# Patient Record
Sex: Male | Born: 1990 | Race: Black or African American | Hispanic: No | Marital: Single | Smoking: Current every day smoker
Health system: Southern US, Community
[De-identification: ages and names within clinical notes are randomized; demographics above are authoritative.]

## PROBLEM LIST (undated history)

## (undated) DIAGNOSIS — F209 Schizophrenia, unspecified: Secondary | ICD-10-CM

## (undated) DIAGNOSIS — F319 Bipolar disorder, unspecified: Secondary | ICD-10-CM

---
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
## 2012-11-15 ENCOUNTER — Encounter (HOSPITAL_COMMUNITY): Payer: Self-pay | Admitting: Emergency Medicine

## 2012-11-15 ENCOUNTER — Emergency Department (HOSPITAL_COMMUNITY)
Admission: EM | Admit: 2012-11-15 | Discharge: 2012-11-15 | Disposition: A | Discharge status: Home / Self Care | Payer: Self-pay | Attending: Emergency Medicine | Admitting: Emergency Medicine

## 2012-11-15 DIAGNOSIS — T43204A Poisoning by unspecified antidepressants, undetermined, initial encounter: Secondary | ICD-10-CM | POA: Insufficient documentation

## 2012-11-15 DIAGNOSIS — F17200 Nicotine dependence, unspecified, uncomplicated: Secondary | ICD-10-CM | POA: Insufficient documentation

## 2012-11-15 DIAGNOSIS — T50992A Poisoning by other drugs, medicaments and biological substances, intentional self-harm, initial encounter: Secondary | ICD-10-CM | POA: Insufficient documentation

## 2012-11-15 DIAGNOSIS — T438X2A Poisoning by other psychotropic drugs, intentional self-harm, initial encounter: Secondary | ICD-10-CM | POA: Insufficient documentation

## 2012-11-15 DIAGNOSIS — T50905A Adverse effect of unspecified drugs, medicaments and biological substances, initial encounter: Secondary | ICD-10-CM

## 2012-11-15 DIAGNOSIS — T43502A Poisoning by unspecified antipsychotics and neuroleptics, intentional self-harm, initial encounter: Secondary | ICD-10-CM | POA: Insufficient documentation

## 2012-11-15 LAB — RAPID URINE DRUG SCREEN, HOSP PERFORMED
Amphetamines: NOT DETECTED
Barbiturates: NOT DETECTED
Benzodiazepines: NOT DETECTED

## 2012-11-15 LAB — CBC WITH DIFFERENTIAL/PLATELET
Basophils Absolute: 0 10*3/uL (ref 0.0–0.1)
Eosinophils Absolute: 0.1 10*3/uL (ref 0.0–0.7)
Eosinophils Relative: 1 % (ref 0–5)
Lymphocytes Relative: 39 % (ref 12–46)
MCH: 28.6 pg (ref 26.0–34.0)
MCV: 82.8 fL (ref 78.0–100.0)
Platelets: 229 10*3/uL (ref 150–400)
RDW: 13.2 % (ref 11.5–15.5)
WBC: 4.5 10*3/uL (ref 4.0–10.5)

## 2012-11-15 LAB — ACETAMINOPHEN LEVEL: Acetaminophen (Tylenol), Serum: <15 ug/mL (ref 10–30)

## 2012-11-15 LAB — COMPREHENSIVE METABOLIC PANEL
ALT: 13 U/L (ref 0–53)
AST: 23 U/L (ref 0–37)
Calcium: 9.6 mg/dL (ref 8.4–10.5)
Sodium: 137 mEq/L (ref 135–145)
Total Protein: 8.1 g/dL (ref 6.0–8.3)
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
## 2012-12-05 ENCOUNTER — Emergency Department (HOSPITAL_COMMUNITY): Payer: Self-pay

## 2012-12-05 ENCOUNTER — Encounter (HOSPITAL_COMMUNITY): Payer: Self-pay | Admitting: Emergency Medicine

## 2012-12-05 ENCOUNTER — Emergency Department (HOSPITAL_COMMUNITY)
Admission: EM | Admit: 2012-12-05 | Discharge: 2012-12-05 | Disposition: A | Discharge status: Home / Self Care | Payer: Self-pay | Attending: Emergency Medicine | Admitting: Emergency Medicine

## 2012-12-05 DIAGNOSIS — Y9389 Activity, other specified: Secondary | ICD-10-CM | POA: Insufficient documentation

## 2012-12-05 DIAGNOSIS — Y92009 Unspecified place in unspecified non-institutional (private) residence as the place of occurrence of the external cause: Secondary | ICD-10-CM | POA: Insufficient documentation

## 2012-12-05 DIAGNOSIS — F17200 Nicotine dependence, unspecified, uncomplicated: Secondary | ICD-10-CM | POA: Insufficient documentation

## 2012-12-05 DIAGNOSIS — S92309B Fracture of unspecified metatarsal bone(s), unspecified foot, initial encounter for open fracture: Secondary | ICD-10-CM | POA: Insufficient documentation

## 2012-12-05 DIAGNOSIS — S91309A Unspecified open wound, unspecified foot, initial encounter: Secondary | ICD-10-CM | POA: Insufficient documentation

## 2012-12-05 DIAGNOSIS — W3400XA Accidental discharge from unspecified firearms or gun, initial encounter: Secondary | ICD-10-CM | POA: Insufficient documentation

## 2012-12-05 DIAGNOSIS — S91135A Puncture wound without foreign body of left lesser toe(s) without damage to nail, initial encounter: Secondary | ICD-10-CM

## 2012-12-05 MED ORDER — HYDROCODONE-ACETAMINOPHEN 5-325 MG PO TABS: 2.0000 | ORAL_TABLET | Freq: Four times a day (QID) | ORAL | Status: DC | PRN

## 2012-12-05 MED ORDER — CEFAZOLIN SODIUM 1 G IJ SOLR
1.0000 g | Freq: Once | INTRAMUSCULAR | Status: AC
  Administered 2012-12-05: 1 g via INTRAMUSCULAR
  Filled 2012-12-05: qty 10

## 2012-12-05 MED ORDER — OXYCODONE-ACETAMINOPHEN 5-325 MG PO TABS
1.0000 | ORAL_TABLET | Freq: Once | ORAL | Status: AC
  Administered 2012-12-05: 1 via ORAL
  Filled 2012-12-05: qty 1
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
## 2012-12-05 NOTE — ED Notes (Signed)
Pt reluctant to get undressed. Finally complied. Pt placed in gown and clothes, watch and jacket placed in bag and given to [employer]. Several [employer] officers in the hall outside of pt's room.

## 2012-12-05 NOTE — ED Notes (Signed)
Ortho notified of splint orders

## 2013-04-01 ENCOUNTER — Emergency Department (INDEPENDENT_AMBULATORY_CARE_PROVIDER_SITE_OTHER): Payer: Self-pay

## 2013-04-01 ENCOUNTER — Encounter (HOSPITAL_COMMUNITY): Payer: Self-pay | Admitting: *Deleted

## 2013-04-01 ENCOUNTER — Emergency Department (INDEPENDENT_AMBULATORY_CARE_PROVIDER_SITE_OTHER)
Admission: EM | Admit: 2013-04-01 | Discharge: 2013-04-01 | Disposition: A | Discharge status: Home / Self Care | Payer: Self-pay | Source: Home / Self Care | Attending: Emergency Medicine | Admitting: Emergency Medicine

## 2013-04-01 DIAGNOSIS — S43429A Sprain of unspecified rotator cuff capsule, initial encounter: Secondary | ICD-10-CM

## 2013-04-01 DIAGNOSIS — S39012A Strain of muscle, fascia and tendon of lower back, initial encounter: Secondary | ICD-10-CM

## 2013-04-01 DIAGNOSIS — S335XXA Sprain of ligaments of lumbar spine, initial encounter: Secondary | ICD-10-CM

## 2013-04-01 DIAGNOSIS — S46011A Strain of muscle(s) and tendon(s) of the rotator cuff of right shoulder, initial encounter: Secondary | ICD-10-CM

## 2013-04-01 DIAGNOSIS — S139XXA Sprain of joints and ligaments of unspecified parts of neck, initial encounter: Secondary | ICD-10-CM

## 2013-04-01 DIAGNOSIS — S161XXA Strain of muscle, fascia and tendon at neck level, initial encounter: Secondary | ICD-10-CM

## 2013-04-01 MED ORDER — HYDROCODONE-ACETAMINOPHEN 5-325 MG PO TABS: ORAL_TABLET | ORAL | Status: DC

## 2013-04-01 MED ORDER — CYCLOBENZAPRINE HCL 5 MG PO TABS: 5.0000 mg | ORAL_TABLET | Freq: Three times a day (TID) | ORAL | Status: DC | PRN

## 2013-04-01 MED ORDER — HYDROCODONE-ACETAMINOPHEN 5-325 MG PO TABS
1.0000 | ORAL_TABLET | Freq: Once | ORAL | Status: AC
  Administered 2013-04-01: 1 via ORAL

## 2013-04-01 MED ORDER — DICLOFENAC SODIUM 75 MG PO TBEC: 75.0000 mg | DELAYED_RELEASE_TABLET | Freq: Two times a day (BID) | ORAL | Status: DC

## 2013-04-01 MED ORDER — HYDROCODONE-ACETAMINOPHEN 5-325 MG PO TABS
ORAL_TABLET | ORAL | Status: AC
  Filled 2013-04-01: qty 1

## 2013-04-01 MED ORDER — IBUPROFEN 800 MG PO TABS
800.0000 mg | ORAL_TABLET | Freq: Once | ORAL | Status: AC
  Administered 2013-04-01: 800 mg via ORAL

## 2013-04-01 MED ORDER — IBUPROFEN 800 MG PO TABS
ORAL_TABLET | ORAL | Status: AC
  Filled 2013-04-01: qty 1
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
## 2013-04-01 NOTE — ED Notes (Signed)
Patient complains of neck, back and side pain. States stiffness and aching in neck.

## 2015-07-13 ENCOUNTER — Emergency Department (HOSPITAL_COMMUNITY)
Admission: EM | Admit: 2015-07-13 | Discharge: 2015-07-13 | Disposition: A | Discharge status: Home / Self Care | Payer: Self-pay | Attending: Emergency Medicine | Admitting: Emergency Medicine

## 2015-07-13 ENCOUNTER — Encounter (HOSPITAL_COMMUNITY): Payer: Self-pay | Admitting: Emergency Medicine

## 2015-07-13 ENCOUNTER — Emergency Department (HOSPITAL_COMMUNITY): Payer: Self-pay

## 2015-07-13 DIAGNOSIS — S0181XA Laceration without foreign body of other part of head, initial encounter: Secondary | ICD-10-CM

## 2015-07-13 DIAGNOSIS — S0083XA Contusion of other part of head, initial encounter: Secondary | ICD-10-CM

## 2015-07-13 DIAGNOSIS — S199XXA Unspecified injury of neck, initial encounter: Secondary | ICD-10-CM | POA: Insufficient documentation

## 2015-07-13 DIAGNOSIS — S0990XA Unspecified injury of head, initial encounter: Secondary | ICD-10-CM | POA: Insufficient documentation

## 2015-07-13 DIAGNOSIS — S01111A Laceration without foreign body of right eyelid and periocular area, initial encounter: Secondary | ICD-10-CM | POA: Insufficient documentation

## 2015-07-13 DIAGNOSIS — Z720 Tobacco use: Secondary | ICD-10-CM | POA: Insufficient documentation

## 2015-07-13 DIAGNOSIS — Y929 Unspecified place or not applicable: Secondary | ICD-10-CM | POA: Insufficient documentation

## 2015-07-13 DIAGNOSIS — S40211A Abrasion of right shoulder, initial encounter: Secondary | ICD-10-CM | POA: Insufficient documentation

## 2015-07-13 DIAGNOSIS — Z23 Encounter for immunization: Secondary | ICD-10-CM | POA: Insufficient documentation

## 2015-07-13 DIAGNOSIS — K0381 Cracked tooth: Secondary | ICD-10-CM | POA: Insufficient documentation

## 2015-07-13 DIAGNOSIS — S060X1A Concussion with loss of consciousness of 30 minutes or less, initial encounter: Secondary | ICD-10-CM | POA: Insufficient documentation

## 2015-07-13 DIAGNOSIS — Y999 Unspecified external cause status: Secondary | ICD-10-CM | POA: Insufficient documentation

## 2015-07-13 DIAGNOSIS — Y939 Activity, unspecified: Secondary | ICD-10-CM | POA: Insufficient documentation

## 2015-07-13 LAB — SAMPLE TO BLOOD BANK

## 2015-07-13 LAB — ETHANOL

## 2015-07-13 LAB — CDS SEROLOGY

## 2015-07-13 MED ORDER — HYDROCODONE-ACETAMINOPHEN 5-325 MG PO TABS: 2.0000 | ORAL_TABLET | Freq: Four times a day (QID) | ORAL | Status: DC | PRN

## 2015-07-13 MED ORDER — LIDOCAINE-EPINEPHRINE 1 %-1:100000 IJ SOLN
10.0000 mL | Freq: Once | INTRAMUSCULAR | Status: AC
  Administered 2015-07-13: 10 mL via INTRADERMAL
  Filled 2015-07-13: qty 1

## 2015-07-13 MED ORDER — ONDANSETRON HCL 4 MG PO TABS: 4.0000 mg | ORAL_TABLET | Freq: Four times a day (QID) | ORAL | Status: DC

## 2015-07-13 MED ORDER — OXYCODONE-ACETAMINOPHEN 5-325 MG PO TABS
1.0000 | ORAL_TABLET | Freq: Once | ORAL | Status: AC
  Administered 2015-07-13: 1 via ORAL
  Filled 2015-07-13: qty 1

## 2015-07-13 MED ORDER — ONDANSETRON HCL 4 MG PO TABS
4.0000 mg | ORAL_TABLET | Freq: Once | ORAL | Status: AC
  Administered 2015-07-13: 4 mg via ORAL
  Filled 2015-07-13: qty 1

## 2015-07-13 MED ORDER — TETANUS-DIPHTH-ACELL PERTUSSIS 5-2.5-18.5 LF-MCG/0.5 IM SUSP
0.5000 mL | Freq: Once | INTRAMUSCULAR | Status: AC
  Administered 2015-07-13: 0.5 mL via INTRAMUSCULAR
  Filled 2015-07-13: qty 0.5

## 2015-07-13 MED ORDER — HYDROMORPHONE HCL 1 MG/ML IJ SOLN
1.0000 mg | Freq: Once | INTRAMUSCULAR | Status: AC
  Administered 2015-07-13: 1 mg via INTRAVENOUS
  Filled 2015-07-13: qty 1
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
## 2015-07-13 NOTE — Discharge Instructions (Signed)
Concussion  A concussion, or closed-head injury, is a brain injury caused by a direct blow to the head or by a quick and sudden movement (jolt) of the head or neck. Concussions are usually not life-threatening. Even so, the effects of a concussion can be serious. If you have had a concussion before, you are more likely to experience concussion-like symptoms after a direct blow to the head.   CAUSES  · Direct blow to the head, such as from running into another player during a soccer game, being hit in a fight, or hitting your head on a hard surface.  · A jolt of the head or neck that causes the brain to move back and forth inside the skull, such as in a car crash.  SIGNS AND SYMPTOMS  The signs of a concussion can be hard to notice. Early on, they may be missed by you, family members, and health care providers. You may look fine but act or feel differently.  Symptoms are usually temporary, but they may last for days, weeks, or even longer. Some symptoms may appear right away while others may not show up for hours or days. Every head injury is different. Symptoms include:  · Mild to moderate headaches that will not go away.  · A feeling of pressure inside your head.  · Having more trouble than usual:  ¨ Learning or remembering things you have heard.  ¨ Answering questions.  ¨ Paying attention or concentrating.  ¨ Organizing daily tasks.  ¨ Making decisions and solving problems.  · Slowness in thinking, acting or reacting, speaking, or reading.  · Getting lost or being easily confused.  · Feeling tired all the time or lacking energy (fatigued).  · Feeling drowsy.  · Sleep disturbances.  ¨ Sleeping more than usual.  ¨ Sleeping less than usual.  ¨ Trouble falling asleep.  ¨ Trouble sleeping (insomnia).  · Loss of balance or feeling lightheaded or dizzy.  · Nausea or vomiting.  · Numbness or tingling.  · Increased sensitivity to:  ¨ Sounds.  ¨ Lights.  ¨ Distractions.  · Vision problems or eyes that tire  easily.  · Diminished sense of taste or smell.  · Ringing in the ears.  · Mood changes such as feeling sad or anxious.  · Becoming easily irritated or angry for little or no reason.  · Lack of motivation.  · Seeing or hearing things other people do not see or hear (hallucinations).  DIAGNOSIS  Your health care provider can usually diagnose a concussion based on a description of your injury and symptoms. He or she will ask whether you passed out (lost consciousness) and whether you are having trouble remembering events that happened right before and during your injury.  Your evaluation might include:  · A brain scan to look for signs of injury to the brain. Even if the test shows no injury, you may still have a concussion.  · Blood tests to be sure other problems are not present.  TREATMENT  · Concussions are usually treated in an emergency department, in urgent care, or at a clinic. You may need to stay in the hospital overnight for further treatment.  · Tell your health care provider if you are taking any medicines, including prescription medicines, over-the-counter medicines, and natural remedies. Some medicines, such as blood thinners (anticoagulants) and aspirin, may increase the chance of complications. Also tell your health care provider whether you have had alcohol or are taking illegal drugs. This information   may affect treatment.  · Your health care provider will send you home with important instructions to follow.  · How fast you will recover from a concussion depends on many factors. These factors include how severe your concussion is, what part of your brain was injured, your age, and how healthy you were before the concussion.  · Most people with mild injuries recover fully. Recovery can take time. In general, recovery is slower in older persons. Also, persons who have had a concussion in the past or have other medical problems may find that it takes longer to recover from their current injury.  HOME  CARE INSTRUCTIONS  General Instructions  · Carefully follow the directions your health care provider gave you.  · Only take over-the-counter or prescription medicines for pain, discomfort, or fever as directed by your health care provider.  · Take only those medicines that your health care provider has approved.  · Do not drink alcohol until your health care provider says you are well enough to do so. Alcohol and certain other drugs may slow your recovery and can put you at risk of further injury.  · If it is harder than usual to remember things, write them down.  · If you are easily distracted, try to do one thing at a time. For example, do not try to watch TV while fixing dinner.  · Talk with family members or close friends when making important decisions.  · Keep all follow-up appointments. Repeated evaluation of your symptoms is recommended for your recovery.  · Watch your symptoms and tell others to do the same. Complications sometimes occur after a concussion. Older adults with a brain injury may have a higher risk of serious complications, such as a blood clot on the brain.  · Tell your teachers, school nurse, school counselor, coach, athletic trainer, or work manager about your injury, symptoms, and restrictions. Tell them about what you can or cannot do. They should watch for:  ¨ Increased problems with attention or concentration.  ¨ Increased difficulty remembering or learning new information.  ¨ Increased time needed to complete tasks or assignments.  ¨ Increased irritability or decreased ability to cope with stress.  ¨ Increased symptoms.  · Rest. Rest helps the brain to heal. Make sure you:  ¨ Get plenty of sleep at night. Avoid staying up late at night.  ¨ Keep the same bedtime hours on weekends and weekdays.  ¨ Rest during the day. Take daytime naps or rest breaks when you feel tired.  · Limit activities that require a lot of thought or concentration. These include:  ¨ Doing homework or job-related  work.  ¨ Watching TV.  ¨ Working on the computer.  · Avoid any situation where there is potential for another head injury (football, hockey, soccer, basketball, martial arts, downhill snow sports and horseback riding). Your condition will get worse every time you experience a concussion. You should avoid these activities until you are evaluated by the appropriate follow-up health care providers.  Returning To Your Regular Activities  You will need to return to your normal activities slowly, not all at once. You must give your body and brain enough time for recovery.  · Do not return to sports or other athletic activities until your health care provider tells you it is safe to do so.  · Ask your health care provider when you can drive, ride a bicycle, or operate heavy machinery. Your ability to react may be slower after a   brain injury. Never do these activities if you are dizzy.  · Ask your health care provider about when you can return to work or school.  Preventing Another Concussion  It is very important to avoid another brain injury, especially before you have recovered. In rare cases, another injury can lead to permanent brain damage, brain swelling, or death. The risk of this is greatest during the first 7-10 days after a head injury. Avoid injuries by:  · Wearing a seat belt when riding in a car.  · Drinking alcohol only in moderation.  · Wearing a helmet when biking, skiing, skateboarding, skating, or doing similar activities.  · Avoiding activities that could lead to a second concussion, such as contact or recreational sports, until your health care provider says it is okay.  · Taking safety measures in your home.  ¨ Remove clutter and tripping hazards from floors and stairways.  ¨ Use grab bars in bathrooms and handrails by stairs.  ¨ Place non-slip mats on floors and in bathtubs.  ¨ Improve lighting in dim areas.  SEEK MEDICAL CARE IF:  · You have increased problems paying attention or  concentrating.  · You have increased difficulty remembering or learning new information.  · You need more time to complete tasks or assignments than before.  · You have increased irritability or decreased ability to cope with stress.  · You have more symptoms than before.  Seek medical care if you have any of the following symptoms for more than 2 weeks after your injury:  · Lasting (chronic) headaches.  · Dizziness or balance problems.  · Nausea.  · Vision problems.  · Increased sensitivity to noise or light.  · Depression or mood swings.  · Anxiety or irritability.  · Memory problems.  · Difficulty concentrating or paying attention.  · Sleep problems.  · Feeling tired all the time.  SEEK IMMEDIATE MEDICAL CARE IF:  · You have severe or worsening headaches. These may be a sign of a blood clot in the brain.  · You have weakness (even if only in one hand, leg, or part of the face).  · You have numbness.  · You have decreased coordination.  · You vomit repeatedly.  · You have increased sleepiness.  · One pupil is larger than the other.  · You have convulsions.  · You have slurred speech.  · You have increased confusion. This may be a sign of a blood clot in the brain.  · You have increased restlessness, agitation, or irritability.  · You are unable to recognize people or places.  · You have neck pain.  · It is difficult to wake you up.  · You have unusual behavior changes.  · You lose consciousness.  MAKE SURE YOU:  · Understand these instructions.  · Will watch your condition.  · Will get help right away if you are not doing well or get worse.  Document Released: 02/12/2004 Document Revised: 11/27/2013 Document Reviewed: 06/14/2013  [employer]® Patient Information ©2015 [employer]. This information is not intended to replace advice given to you by your health care provider. Make sure you discuss any questions you have with your health care provider.

## 2015-07-13 NOTE — ED Notes (Signed)
Pt ambulated to the bathroom with ease 

## 2015-07-13 NOTE — ED Notes (Signed)
Patient returned from CT
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
## 2019-12-25 ENCOUNTER — Inpatient Hospital Stay (HOSPITAL_COMMUNITY)
Admission: RE | Admit: 2019-12-25 | Discharge: 2020-01-02 | DRG: 885 | Disposition: A | Discharge status: Home / Self Care | Payer: Federal, State, Local not specified - Other | Attending: Psychiatry | Admitting: Psychiatry

## 2019-12-25 ENCOUNTER — Ambulatory Visit (HOSPITAL_COMMUNITY)
Admission: RE | Admit: 2019-12-25 | Discharge: 2019-12-25 | Disposition: A | Discharge status: Home / Self Care | Payer: Self-pay | Attending: Psychiatry | Admitting: Psychiatry

## 2019-12-25 DIAGNOSIS — F209 Schizophrenia, unspecified: Secondary | ICD-10-CM | POA: Insufficient documentation

## 2019-12-25 DIAGNOSIS — Z20822 Contact with and (suspected) exposure to covid-19: Secondary | ICD-10-CM | POA: Diagnosis present

## 2019-12-25 DIAGNOSIS — T43595A Adverse effect of other antipsychotics and neuroleptics, initial encounter: Secondary | ICD-10-CM | POA: Diagnosis not present

## 2019-12-25 DIAGNOSIS — R45851 Suicidal ideations: Secondary | ICD-10-CM

## 2019-12-25 DIAGNOSIS — I952 Hypotension due to drugs: Secondary | ICD-10-CM | POA: Diagnosis not present

## 2019-12-25 DIAGNOSIS — F322 Major depressive disorder, single episode, severe without psychotic features: Secondary | ICD-10-CM

## 2019-12-25 DIAGNOSIS — F17210 Nicotine dependence, cigarettes, uncomplicated: Secondary | ICD-10-CM | POA: Diagnosis present

## 2019-12-25 DIAGNOSIS — Y92239 Unspecified place in hospital as the place of occurrence of the external cause: Secondary | ICD-10-CM | POA: Diagnosis not present

## 2019-12-25 DIAGNOSIS — Z653 Problems related to other legal circumstances: Secondary | ICD-10-CM

## 2019-12-25 DIAGNOSIS — F5105 Insomnia due to other mental disorder: Secondary | ICD-10-CM | POA: Diagnosis present

## 2019-12-25 HISTORY — DX: Schizophrenia, unspecified: F20.9

## 2019-12-25 HISTORY — DX: Bipolar disorder, unspecified: F31.9

## 2019-12-26 ENCOUNTER — Other Ambulatory Visit: Payer: Self-pay

## 2019-12-26 ENCOUNTER — Encounter (HOSPITAL_COMMUNITY): Payer: Self-pay | Admitting: Psychiatry

## 2019-12-26 DIAGNOSIS — R440 Auditory hallucinations: Secondary | ICD-10-CM

## 2019-12-26 DIAGNOSIS — Z20822 Contact with and (suspected) exposure to covid-19: Secondary | ICD-10-CM | POA: Diagnosis present

## 2019-12-26 DIAGNOSIS — F209 Schizophrenia, unspecified: Principal | ICD-10-CM | POA: Diagnosis present

## 2019-12-26 DIAGNOSIS — Y92239 Unspecified place in hospital as the place of occurrence of the external cause: Secondary | ICD-10-CM | POA: Diagnosis not present

## 2019-12-26 DIAGNOSIS — F201 Disorganized schizophrenia: Secondary | ICD-10-CM

## 2019-12-26 DIAGNOSIS — F17210 Nicotine dependence, cigarettes, uncomplicated: Secondary | ICD-10-CM | POA: Diagnosis present

## 2019-12-26 DIAGNOSIS — F5105 Insomnia due to other mental disorder: Secondary | ICD-10-CM | POA: Diagnosis present

## 2019-12-26 DIAGNOSIS — I952 Hypotension due to drugs: Secondary | ICD-10-CM | POA: Diagnosis not present

## 2019-12-26 DIAGNOSIS — Z653 Problems related to other legal circumstances: Secondary | ICD-10-CM | POA: Diagnosis not present

## 2019-12-26 DIAGNOSIS — R45850 Homicidal ideations: Secondary | ICD-10-CM

## 2019-12-26 DIAGNOSIS — F200 Paranoid schizophrenia: Secondary | ICD-10-CM | POA: Diagnosis not present

## 2019-12-26 DIAGNOSIS — T43595A Adverse effect of other antipsychotics and neuroleptics, initial encounter: Secondary | ICD-10-CM | POA: Diagnosis not present

## 2019-12-26 LAB — URINALYSIS, COMPLETE (UACMP) WITH MICROSCOPIC
Bilirubin Urine: NEGATIVE
Glucose, UA: NEGATIVE mg/dL
Hgb urine dipstick: NEGATIVE
Ketones, ur: 5 mg/dL — AB
Leukocytes,Ua: NEGATIVE
Nitrite: NEGATIVE
Protein, ur: 30 mg/dL — AB
Specific Gravity, Urine: 1.033 — ABNORMAL HIGH (ref 1.005–1.030)
pH: 6 (ref 5.0–8.0)

## 2019-12-26 LAB — HEMOGLOBIN A1C
Hgb A1c MFr Bld: 5.5 % (ref 4.8–5.6)
Mean Plasma Glucose: 111.15 mg/dL

## 2019-12-26 LAB — RAPID URINE DRUG SCREEN, HOSP PERFORMED
Amphetamines: NOT DETECTED
Barbiturates: NOT DETECTED
Benzodiazepines: NOT DETECTED
Cocaine: NOT DETECTED
Opiates: NOT DETECTED
Tetrahydrocannabinol: NOT DETECTED

## 2019-12-26 LAB — ETHANOL: Alcohol, Ethyl (B): <10 mg/dL (ref <10)

## 2019-12-26 LAB — HEPATIC FUNCTION PANEL
ALT: 25 U/L (ref 0–44)
AST: 35 U/L (ref 15–41)
Albumin: 4.3 g/dL (ref 3.5–5.0)
Alkaline Phosphatase: 75 U/L (ref 38–126)
Bilirubin, Direct: 0.1 mg/dL (ref 0.0–0.2)
Indirect Bilirubin: 0.6 mg/dL (ref 0.3–0.9)
Total Bilirubin: 0.7 mg/dL (ref 0.3–1.2)
Total Protein: 6.4 g/dL — ABNORMAL LOW (ref 6.5–8.1)

## 2019-12-26 LAB — MAGNESIUM: Magnesium: 2 mg/dL (ref 1.7–2.4)

## 2019-12-26 LAB — LIPID PANEL
Cholesterol: 134 mg/dL (ref 0–200)
HDL: 43 mg/dL (ref >40)
LDL Cholesterol: 82 mg/dL (ref 0–99)
Total CHOL/HDL Ratio: 3.1 RATIO
Triglycerides: 46 mg/dL (ref <150)
VLDL: 9 mg/dL (ref 0–40)

## 2019-12-26 LAB — COMPREHENSIVE METABOLIC PANEL
ALT: 23 U/L (ref 0–44)
AST: 35 U/L (ref 15–41)
Albumin: 4.2 g/dL (ref 3.5–5.0)
Alkaline Phosphatase: 74 U/L (ref 38–126)
Anion gap: 7 (ref 5–15)
BUN: 13 mg/dL (ref 6–20)
CO2: 26 mmol/L (ref 22–32)
Calcium: 9.2 mg/dL (ref 8.9–10.3)
Chloride: 107 mmol/L (ref 98–111)
Creatinine, Ser: 0.79 mg/dL (ref 0.61–1.24)
GFR calc Af Amer: >60 mL/min (ref >60)
GFR calc non Af Amer: >60 mL/min (ref >60)
Glucose, Bld: 100 mg/dL — ABNORMAL HIGH (ref 70–99)
Potassium: 3.4 mmol/L — ABNORMAL LOW (ref 3.5–5.1)
Sodium: 140 mmol/L (ref 135–145)
Total Bilirubin: 0.7 mg/dL (ref 0.3–1.2)
Total Protein: 6.5 g/dL (ref 6.5–8.1)

## 2019-12-26 LAB — RESPIRATORY PANEL BY RT PCR (FLU A&B, COVID)
Influenza A by PCR: NEGATIVE
Influenza B by PCR: NEGATIVE
SARS Coronavirus 2 by RT PCR: NEGATIVE

## 2019-12-26 LAB — GLUCOSE, CAPILLARY: Glucose-Capillary: 92 mg/dL (ref 70–99)

## 2019-12-26 LAB — TSH: TSH: 0.922 u[IU]/mL (ref 0.350–4.500)

## 2019-12-26 MED ORDER — TRAZODONE HCL 50 MG PO TABS: 50.0000 mg | ORAL_TABLET | Freq: Every evening | ORAL | Status: DC | PRN

## 2019-12-26 MED ORDER — RISPERIDONE 3 MG PO TABS
3.0000 mg | ORAL_TABLET | Freq: Two times a day (BID) | ORAL | Status: DC
  Administered 2019-12-26: 17:00:00 3 mg via ORAL
  Filled 2019-12-26 (×4): qty 1

## 2019-12-26 MED ORDER — TRAZODONE HCL 150 MG PO TABS
300.0000 mg | ORAL_TABLET | Freq: Every day | ORAL | Status: DC
  Administered 2019-12-26 – 2019-12-27 (×2): 300 mg via ORAL
  Filled 2019-12-26 (×4): qty 2

## 2019-12-26 MED ORDER — ALUM & MAG HYDROXIDE-SIMETH 200-200-20 MG/5ML PO SUSP
30.0000 mL | ORAL | Status: DC | PRN
Start: 2019-12-26 — End: 2020-01-02

## 2019-12-26 MED ORDER — BENZTROPINE MESYLATE 1 MG PO TABS
1.0000 mg | ORAL_TABLET | Freq: Two times a day (BID) | ORAL | Status: DC
  Administered 2019-12-26: 17:00:00 1 mg via ORAL
  Filled 2019-12-26 (×6): qty 1

## 2019-12-26 MED ORDER — DIVALPROEX SODIUM 250 MG PO DR TAB
250.0000 mg | DELAYED_RELEASE_TABLET | Freq: Three times a day (TID) | ORAL | Status: DC
  Administered 2019-12-26 – 2019-12-27 (×3): 250 mg via ORAL
  Filled 2019-12-26 (×9): qty 1

## 2019-12-26 MED ORDER — HYDROXYZINE HCL 25 MG PO TABS
25.0000 mg | ORAL_TABLET | Freq: Three times a day (TID) | ORAL | Status: DC | PRN
  Administered 2019-12-28 – 2020-01-01 (×6): 25 mg via ORAL
  Filled 2019-12-26 (×6): qty 1

## 2019-12-26 MED ORDER — MAGNESIUM HYDROXIDE 400 MG/5ML PO SUSP: 30.0000 mL | Freq: Every day | ORAL | Status: DC | PRN

## 2019-12-26 MED ORDER — RISPERIDONE 1 MG PO TABS
1.0000 mg | ORAL_TABLET | Freq: Two times a day (BID) | ORAL | Status: DC
  Administered 2019-12-26: 09:00:00 1 mg via ORAL
  Filled 2019-12-26 (×3): qty 1

## 2019-12-26 MED ORDER — ACETAMINOPHEN 325 MG PO TABS
650.0000 mg | ORAL_TABLET | Freq: Four times a day (QID) | ORAL | Status: DC | PRN
  Administered 2019-12-30 – 2020-01-01 (×2): 650 mg via ORAL
  Filled 2019-12-26 (×2): qty 2
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
## 2019-12-26 NOTE — Progress Notes (Signed)
This morning staff entered into patient's room to check his cbg, when patient saw staff and he become sexually occupied and exposed hisself to Staff. Patient was redirected and informed that his actions were inappropriate.

## 2019-12-26 NOTE — Plan of Care (Signed)
[facility] Observation Crisis Plan  Reason for Crisis Plan:  Crisis Stabilization   Plan of Care:  Referral for IOP  Family Support:      Current Living Environment:     Insurance:   Hospital Account    Name Acct ID Class Status Primary Coverage   Alex, Kline [id number] BEHAVIORAL HEALTH OBSERVATION Open None        Guarantor Account (for Hospital Account [id number])    Name Relation to Pt Service Area Active? Acct Type   [name] Self [facility] Yes [facility]   Address Phone       [address] [place], [place] [address] [phone])          Coverage Information (for Hospital Account [id number])    Not on file      Legal Guardian:     Primary Care Provider:  Default, Provider, MD  Current Outpatient Providers:  None  Psychiatrist:     Counselor/Therapist:     Compliant with Medications:  No  Additional Information:   Tasia Catchings 1/20/202112:40 AM

## 2019-12-26 NOTE — Progress Notes (Signed)
Psychoeducational Group Note  Date:  12/26/2019 Time:  2207  Group Topic/Focus:  Wrap-Up Group:   The focus of this group is to help patients review their daily goal of treatment and discuss progress on daily workbooks.  Participation Level: Did Not Attend  Participation Quality:  Not Applicable  Affect:  Not Applicable  Cognitive:  Not Applicable  Insight:  Not Applicable  Engagement in Group: Not Applicable  Additional Comments:  The patient did not attend group since he was asleep in his bedroom.   Hazle Coca S 12/26/2019, 10:07 PM

## 2019-12-26 NOTE — Discharge Summary (Addendum)
  Patient to be transferred to [facility] inpatient for psychiatric treatment  Attest to NP Note

## 2019-12-26 NOTE — Progress Notes (Signed)
   12/26/19 2000  Psych Admission Type (Psych Patients Only)  Admission Status Voluntary  Psychosocial Assessment  Patient Complaints Isolation  Eye Contact Fair  Facial Expression Flat  Affect Sad;Flat  Speech Soft  Interaction Guarded  Motor Activity Slow  Appearance/Hygiene Unremarkable  Behavior Characteristics Cooperative  Mood Anxious  Thought Process  Coherency Unable to assess  Content Ambivalence  Delusions WDL  Perception Hallucinations  Hallucination Auditory  Judgment Impaired  Confusion None  Danger to Self  Current suicidal ideation? Denies  Self-Injurious Behavior No self-injurious ideation or behavior indicators observed or expressed   Agreement Not to Harm Self Yes  Description of Agreement Verbal  Danger to Others  Danger to Others None reported or observed  Danger to Others Abnormal  Harmful Behavior to others Threats of violence towards other people observed or expressed   Destructive Behavior No threats or harm toward property   Pt isolates to his room , stated he was ok
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
## 2019-12-26 NOTE — BH Assessment (Signed)
[facility]HH Assessment Progress Note   Per Shuvon Rankin, FNP, this pt requires psychiatric hospitalization at this time.  Pt has been assigned pt to [facility] virtual Rm [id number] in anticipation of a bed becoming available later today.  Pt's nurse has been notified.  Doylene Canning, [place] [facility] Coordinator [phone]
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
## 2019-12-27 DIAGNOSIS — F200 Paranoid schizophrenia: Secondary | ICD-10-CM

## 2019-12-27 LAB — PROLACTIN: Prolactin: 24.8 ng/mL — ABNORMAL HIGH (ref 4.0–15.2)

## 2019-12-27 MED ORDER — OLANZAPINE 10 MG PO TBDP
10.0000 mg | ORAL_TABLET | Freq: Every day | ORAL | Status: DC
  Administered 2019-12-27: 21:00:00 10 mg via ORAL
  Filled 2019-12-27 (×3): qty 1

## 2019-12-27 MED ORDER — BENZTROPINE MESYLATE 0.5 MG PO TABS
0.5000 mg | ORAL_TABLET | Freq: Two times a day (BID) | ORAL | Status: DC
  Administered 2019-12-27 – 2019-12-28 (×2): 0.5 mg via ORAL
  Filled 2019-12-27 (×4): qty 1

## 2019-12-27 MED ORDER — RISPERIDONE 1 MG PO TABS
1.0000 mg | ORAL_TABLET | Freq: Two times a day (BID) | ORAL | Status: DC
  Filled 2019-12-27 (×4): qty 1
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
## 2019-12-27 NOTE — [facility] Group Notes (Cosign Needed)
[facility] LCSW Group Therapy Note  Date/Time: 1/21 1:30pm   Type of Therapy and Topic:  Group Therapy:  Overcoming Obstacles  Participation Level:  [facility] PARTICIPATION LEVEL: Active  Description of Group:    In this group patients will be encouraged to explore what they see as obstacles to their own wellness and recovery. They will be guided to discuss their thoughts, feelings, and behaviors related to these obstacles. The group will process together ways to cope with barriers, with attention given to specific choices patients can make. Each patient will be challenged to identify changes they are motivated to make in order to overcome their obstacles. This group will be process-oriented, with patients participating in exploration of their own experiences as well as giving and receiving support and challenge from other group members.  Therapeutic Goals: 1. Patient will identify personal and current obstacles as they relate to admission. 2. Patient will identify barriers that currently interfere with their wellness or overcoming obstacles.  3. Patient will identify feelings, thought process and behaviors related to these barriers. 4. Patient will identify two changes they are willing to make to overcome these obstacles:    Summary of Patient Progress  Pt was active and engaged in group. Pt was able to state his obstacles as an "out of body experience". Pt's plan is to take the bus and like people.    Therapeutic Modalities:   Cognitive Behavioral Therapy Solution Focused Therapy Motivational Interviewing Relapse Prevention Therapy   Earlyne Iba, MSW intern

## 2019-12-27 NOTE — Progress Notes (Signed)
Another patient reported to the staff that their was a strange odor in one of the bedrooms. The staff went into the patient's room and determined that the odor was coming from the patient's heating system. The patient was instructed to not use the heating system until it is checked by maintenance. The front panel to the healing system is very loose at this time.

## 2019-12-27 NOTE — [facility] Suicide Risk Assessment (Addendum)
[facility] INPATIENT:  Family/Significant Other Suicide Prevention Education  Suicide Prevention Education:  Contact Attempts: with sister, Alex Kline, [phone] has been identified by the patient as the family member/significant other with whom the patient will be residing, and identified as the person(s) who will aid the patient in the event of a mental health crisis.  With written consent from the patient, two attempts were made to provide suicide prevention education, prior to and/or following the patient's discharge.  We were unsuccessful in providing suicide prevention education.  A suicide education pamphlet was given to the patient to share with family/significant other.  Date and time of first attempt: 12/27/19 @ 11:20 am  Date and time of second attempt: 01/01/2020 @ 11:06am.   CSW attempted twice to contact pt's sister and was not successful. EDIT: 1/26    Reynold Bowen 12/27/2019, 11:19 AM

## 2019-12-27 NOTE — B[facility] Counselor (Signed)
Adult Comprehensive Assessment  Patient ID: Alex Kline, male   DOB: [birth year], 29 y.o.   MRN: [id number]  Information Source: Information source: Patient  Current Stressors:  Patient states their primary concerns and needs for treatment are:: "family issue" Patient states their goals for this hospitilization and ongoing recovery are:: "get better" Educational / Learning stressors: pt denies Employment / Job issues: pt denies Family Relationships: 'family issues.arguments' Financial / Lack of resources (include bankruptcy): pt denies Housing / Lack of housing: "finding place to go" Physical health (include injuries & life threatening diseases): "foot hurts" Social relationships: pt denies Substance abuse: pt denies Bereavement / Loss: pt denies  Living/Environment/Situation:  Living Arrangements: Other (Comment) Who else lives in the home?: parents kicked him out  Family History:  Marital status: Single Are you sexually active?: Yes What is your sexual orientation?: heterosexual Does patient have children?: Yes How many children?: 1 How is patient's relationship with their children?: "I like him"  Childhood History:  By whom was/is the patient raised?: Both parents Description of patient's relationship with caregiver when they were a child: "good" Patient's description of current relationship with people who raised him/her: "not so good" How were you disciplined when you got in trouble as a child/adolescent?: whippings ans time out Does patient have siblings?: Yes Number of Siblings: 3 Description of patient's current relationship with siblings: "alrights" Did patient suffer any verbal/emotional/physical/sexual abuse as a child?: No Did patient suffer from severe childhood neglect?: No Has patient ever been sexually abused/assaulted/raped as an adolescent or adult?: No Was the patient ever a victim of a crime or a disaster?: No Witnessed domestic violence?: No Has  patient been effected by domestic violence as an adult?: No  Education:  Highest grade of school patient has completed: GED Currently a Ship broker?: No Learning disability?: No  Employment/Work Situation:   Employment situation: Unemployed What is the longest time patient has a held a job?: 6-7 months Where was the patient employed at that time?: [employer] Are There Guns or Chiropractor in [place]?: No  Financial Resources:   Financial resources: No income  Alcohol/Substance Abuse:   What has been your use of drugs/alcohol within the last 12 months?: pt denies Alcohol/Substance Abuse Treatment Hx: Denies past history  Social Support System:   Heritage manager System: Manufacturing engineer System: "family" Type of faith/religion: Muslim  Leisure/Recreation:   Leisure and Hobbies: draw  Strengths/Needs:   What is the patient's perception of their strengths?: "talking to girls"  Discharge Plan:   Currently receiving community mental health services: No Patient states concerns and preferences for aftercare planning are: therapy Patient states they will know when they are safe and ready for discharge when: "feel calm" Does patient have access to transportation?: Yes Will patient be returning to same living situation after discharge?: No  Summary/Recommendations:   Summary and Recommendations (to be completed by the evaluator): Pt is a 29 year old male who is diagnosed with Schizophrenia. Pt states "I am here because I have family problems, I need help understanding my family". Pt is sexually preoccupied. Pt states that he is here because his family thinks he should be here, he denies that he does not really know why he is here but then admits that he does hallucinate and see's and hears demons. Pt denies any SI, HI, and any substance use. Pt states that he just got out of prison 3 days ago and that he struggles with transition back into society. Recommendations  for pt; crisis stabilization, therapeutic milieu, medication management, attend and participate in group therapy, and development of a comprehensive mental wellness plan.  Reynold Bowen. 12/27/2019

## 2019-12-27 NOTE — Progress Notes (Signed)
Recreation Therapy Notes  INPATIENT RECREATION THERAPY ASSESSMENT  Patient Details Name: Alex Kline MRN: [id number] DOB: 11/01/1991 Today's Date: 12/27/2019       Information Obtained From: Patient  Able to Participate in Assessment/Interview: Yes  Patient Presentation: Alert(Pt would start rubbing on himself at points during the assessment.)  Reason for Admission (Per Patient): Other (Comments)(Parents; pt stated he has mental health issues)  Patient Stressors: Other (Comment)(not getting in contact with parole officer and could get locked up)  Coping Skills:   Isolation, Journal, Write, Sports, TV, Music, Exercise, Meditate, Deep Breathing, Impulsivity, Prayer, Avoidance, Hot Bath/Shower  Leisure Interests (2+):  Individual - Reading, Individual - Other (Comment)(Eat; Wash; Watch Youtube)  Frequency of Recreation/Participation: Other (Comment)(Daily)  Awareness of Community Resources:  No  [place] of Residence:  [place]  Patient Main Form of Transportation: Public Transportation(Pt also stated he walks.)  Patient Strengths:  Trying to keep some money; Exercise  Patient Identified Areas of Improvement:  Trying to keep some money; Exercise  Patient Goal for Hospitalization:  "answer questions right and take medications"  Current SI (including self-harm):  No  Current HI:  No  Current AVH: No  Staff Intervention Plan: Group Attendance, Collaborate with Interdisciplinary Treatment Team  Consent to Intern Participation: N/A    Caroll Rancher, LRT/CTRS  Caroll Rancher A 12/27/2019, 11:44 AM

## 2019-12-27 NOTE — [facility] Suicide Risk Assessment (Signed)
[facility] Admission Suicide Risk Assessment   Nursing information obtained from:  Patient, Family, Review of record Demographic factors:  Male, Unemployed Current Mental Status:  Suicidal ideation indicated by others, Thoughts of violence towards others Loss Factors:  NA, Legal issues(Incarcerated for past 4 years.) Historical Factors:  Impulsivity Risk Reduction Factors:  Positive social support, Employed, Positive therapeutic relationship, Living with another person, especially a relative, Sense of responsibility to family, Positive coping skills or problem solving skills  Total Time spent with patient: 45 minutes Principal Problem: psychosis Diagnosis:  Active Problems:   Schizophrenia (HCC)  Subjective Data:   Continued Clinical Symptoms:  Alcohol Use Disorder Identification Test Final Score (AUDIT): 4 The "Alcohol Use Disorders Identification Test", Guidelines for Use in Primary Care, Second Edition.  World Science writer [facility]). Score between 0-7:  no or low risk or alcohol related problems. Score between 8-15:  moderate risk of alcohol related problems. Score between 16-19:  high risk of alcohol related problems. Score 20 or above:  warrants further diagnostic evaluation for alcohol dependence and treatment.   CLINICAL FACTORS:   Schizophrenia:   Paranoid or undifferentiated type  Musculoskeletal: Strength & Muscle Tone: within normal limits Gait & Station: normal Patient leans: N/A  Psychiatric Specialty Exam: Physical Exam  Nursing note and vitals reviewed. Constitutional: He appears well-developed and well-nourished.  Cardiovascular: Normal rate and regular rhythm.    Review of Systems  Constitutional: Negative.   HENT: Negative.   Respiratory: Negative.   Cardiovascular: Negative.   Genitourinary: Negative.   Allergic/Immunologic: Negative.   Neurological: Negative.     Blood pressure (!) 78/40, pulse 67, temperature 97.7 F (36.5 C), temperature source  Oral, resp. rate 16, height 5\' 9"  (1.753 m), SpO2 98 %.There is no height or weight on file to calculate BMI.  General Appearance: Casual  Eye Contact:  Fair  Speech:  Slow  Volume:  Decreased  Mood:  Dysphoric  Affect:  Blunt and Congruent  Thought Process:  Irrelevant and Descriptions of Associations: Loose  Orientation:  Full (Time, Place, and Person)  Thought Content:  Illogical, Delusions and Hallucinations: Auditory Visual  Suicidal Thoughts:  No  Homicidal Thoughts:  No  Memory:  Recent;   Fair Remote;   Fair  Judgement:  Impaired  Insight:  Shallow  Psychomotor Activity:  Normal  Concentration:  Concentration: Fair and Attention Span: Fair  Recall:  of Knowledge:  Fair  Language:  Fair  Akathisia:  Negative  Handed:  Right  AIMS (if indicated):     Assets:  Communication Skills  ADL's:  Intact  Cognition:  WNL  Sleep:  Number of Hours: 9.75     COGNITIVE FEATURES THAT CONTRIBUTE TO RISK:  Loss of executive function    SUICIDE RISK:   Minimal: No identifiable suicidal ideation.  Patients presenting with no risk factors but with morbid ruminations; may be classified as minimal risk based on the severity of the depressive symptoms  PLAN OF CARE: Continue work-up/admit for stabilization  I certify that inpatient services furnished can reasonably be expected to improve the patient's condition.   [employer], MD 12/27/2019, 12:00 PM

## 2019-12-27 NOTE — Progress Notes (Signed)
Recreation Therapy Notes  Date: 1.21.21 Time: 0950 Location: 500 [address] Dayroom  Group Topic: Anxiety  Goal Area(s) Addresses:  Patient will identify triggers to anxiety. Patient will identify symptoms of anxiety. Patient will identify coping strategies for dealing with anxiety post d/c.  Intervention: Worksheet, pencils  Activity: Introduction to Anxiety.  Patients were to identify three things that trigger their anxiety.  Patients were to also identify physical symptoms and thoughts they have when anxious.  Patients then identified three positive ways to cope with anxiety.  Education: Communication, Discharge Planning  Education Outcome: Acknowledges understanding/In group clarification offered/Needs additional education.   Clinical Observations/Feedback: Pt did not attend group.    Caroll Rancher, LRT/CTRS         Lillia Abed, Nakeem Murnane A 12/27/2019 11:08 AM

## 2019-12-27 NOTE — Progress Notes (Signed)
Pt stated he got dizzy while waiting for breakfast and eased himself to the floor. Pt given Gatorade and walked back to the unit. Pt Bp 108/74 sitting and 78/40 standing . Pt encouraged to hydrate through the evening.

## 2019-12-27 NOTE — Progress Notes (Signed)
Patient states that he had a good day overall because he enjoyed the meals and because he talked with his peers. His goal for tomorrow is to get up for breakfast.

## 2019-12-27 NOTE — Progress Notes (Signed)
   12/27/19 2100  Psych Admission Type (Psych Patients Only)  Admission Status Voluntary  Psychosocial Assessment  Patient Complaints Anxiety;Worrying  Eye Contact Fair  Facial Expression Flat  Affect Sad;Flat  Speech Soft  Interaction Guarded  Motor Activity Slow  Appearance/Hygiene Unremarkable  Behavior Characteristics Cooperative  Mood Anxious  Thought Process  Coherency Unable to assess  Content Ambivalence  Delusions WDL  Perception Hallucinations  Hallucination Auditory  Judgment Impaired  Confusion None  Danger to Self  Current suicidal ideation? Denies  Self-Injurious Behavior No self-injurious ideation or behavior indicators observed or expressed   Agreement Not to Harm Self Yes  Description of Agreement Verbal  Danger to Others  Danger to Others None reported or observed  Danger to Others Abnormal  Harmful Behavior to others Threats of violence towards other people observed or expressed   Destructive Behavior No threats or harm toward property   Pt visible on the unit this evening. Pt stated he was having issues with his family

## 2019-12-28 MED ORDER — LORAZEPAM 2 MG/ML IJ SOLN
2.0000 mg | INTRAMUSCULAR | Status: DC | PRN
  Administered 2019-12-28: 16:00:00 2 mg via INTRAMUSCULAR
  Filled 2019-12-28: qty 1

## 2019-12-28 MED ORDER — OLANZAPINE 5 MG PO TBDP
15.0000 mg | ORAL_TABLET | Freq: Every day | ORAL | Status: DC
  Administered 2019-12-28: 21:00:00 15 mg via ORAL
  Filled 2019-12-28 (×3): qty 3

## 2019-12-28 MED ORDER — TRAZODONE HCL 150 MG PO TABS
150.0000 mg | ORAL_TABLET | Freq: Every evening | ORAL | Status: DC | PRN
  Administered 2019-12-28 – 2019-12-30 (×3): 150 mg via ORAL
  Filled 2019-12-28 (×3): qty 1

## 2019-12-28 MED ORDER — LORAZEPAM 1 MG PO TABS
2.0000 mg | ORAL_TABLET | ORAL | Status: DC | PRN
  Administered 2019-12-28 – 2019-12-30 (×5): 2 mg via ORAL
  Filled 2019-12-28 (×5): qty 2

## 2019-12-28 NOTE — Plan of Care (Signed)
Nurse discussed anxiety, depression and coping skills with patient.  

## 2019-12-28 NOTE — Progress Notes (Signed)
Recreation Therapy Notes  Date: 1.22.21 Time: 1000 Location: 500 Hall Dayroom  Group Topic: Communication, Team Building, Problem Solving  Goal Area(s) Addresses:  Patient will effectively work with peer towards shared goal.  Patient will identify skill used to make activity successful.  Patient will identify how skills used during activity can be used to reach post d/c goals.   Behavioral Response: Engaged  Intervention: STEM Activity   Activity: In team's, using 10 plastic red cups patients were to stack the cups into a pyramid using a contraption made of a rubber band with 5 strings attached to it.    Education: Pharmacist, community, Building control surveyor.   Education Outcome: Acknowledges education/In group clarification offered/Needs additional education.   Clinical Observations/Feedback:  Pt was quiet but active.  Pt worked well with his peers for a while.  Pt eventually gave up and went in the back corner and started doing stretches and yoga.  Pt was appropriate during group session.    Caroll Rancher, LRT/CTRS   Caroll Rancher A 12/28/2019 10:58 AM

## 2019-12-28 NOTE — Progress Notes (Signed)
D:  Patient's self inventory sheet, patient sleeps good, no sleep medication helpful.  Poor appetite, low energy level, good concentration.  Rated depression 4, hopeless 6, anxiety 3.  Withdrawals, nausea.  SI sometimes, contracts for safety.  Physical problems, rash.  Physical pain, foot, lower back #7.   Goal is exercise.  Plans to talk to MD.  No discharge plans. A:  Medications administered per MD orders.  Emotional support and encouragement given patient.  R:  Denied SI while talking to nurse today, contracts for safety.  Denied HI.  Denied visual hallucinations.  Does hear voices at times.

## 2019-12-28 NOTE — Progress Notes (Signed)
[facility] MD Progress Note  12/28/2019 8:07 AM Alex Kline  MRN:  [id number] Subjective:  This is the first admission at our facility for Mr. 27, 29 year old patient known to have a schizophrenic disorder, also known to be without treatment/medication for at least 6 months.  He presented on January 19 with a cluster of symptoms involving auditory and visual hallucinations, lack of full orientation, exposing himself inappropriately and displaying a sexual preoccupation, and clearly in need of inpatient stabilization.  Patient still a bit sluggish, has been switched to olanzapine due to hypotension we will also lower the dose of trazodone due to orthostasis risk.  He continues to endorse visual hallucinations stating he "saw the devil on the wall" described him as "pink" and when challenged about the unusual nature of this hallucination it does seem genuine.  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  While he was incarcerated father elaborated that he did indeed have success with a particular antipsychotic but cannot recall the name of it.   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  Had a normal childhood until he began abusing cocaine around late teens, had personality changes and began suffering auditory hallucinations by age 8.  He has been incarcerated for the past 2 or 3 years   Was medicated in prison and did well-   Past Medical History: Past Medical History:  Diagnosis Date  . Bipolar disorder (HCC)   . Schizophrenia (HCC)    History reviewed. No pertinent surgical  history. Family History: History reviewed. No pertinent family history. Family Psychiatric  History: Substance abuse Social History:  Social History   Substance and Sexual Activity  Alcohol Use Yes   Comment: occ     Social History   Substance and Sexual Activity  Drug Use Yes  . Types: Marijuana    Social History   Socioeconomic History  . Marital status: Single    Spouse name: Not on file  . Number of children: Not on file  . Years of education: Not on file  . Highest education level: Not on file  Occupational History  . Not on file  Tobacco Use  . Smoking status: Current Every Day Smoker    Types: Cigarettes  . Smokeless tobacco: Never Used  Substance and Sexual Activity  . Alcohol use: Yes    Comment: occ  . Drug use: Yes    Types: Marijuana  . Sexual activity: Not on file  Other Topics Concern  . Not on file  Social History Narrative  . Not on file   Social Determinants of Health   Financial Resource Strain:   . Difficulty of Paying Living Expenses: Not on file  Food Insecurity:   . Worried About Programme researcher, broadcasting/film/video in the Last Year: Not on file  . Ran Out of Food in the Last Year: Not on file  Transportation Needs:   . Lack of Transportation (Medical): Not on file  . Lack of Transportation (Non-Medical): Not on file  Physical Activity:   . Days of Exercise per Week: Not on file  . Minutes of Exercise per Session: Not on  file  Stress:   . Feeling of Stress : Not on file  Social Connections:   . Frequency of Communication with Friends and Family: Not on file  . Frequency of Social Gatherings with Friends and Family: Not on file  . Attends Religious Services: Not on file  . Active Member of Clubs or Organizations: Not on file  . Attends Banker Meetings: Not on file  . Marital Status: Not on file    Sleep: Good  Appetite:  Good  Current Medications: Current Facility-Administered Medications  Medication Dose Route Frequency  Provider Last Rate Last Admin  . acetaminophen (TYLENOL) tablet 650 mg  650 mg Oral Q6H PRN Anike, Adaku C, NP      . alum & mag hydroxide-simeth (MAALOX/MYLANTA) 200-200-20 MG/5ML suspension 30 mL  30 mL Oral Q4H PRN Anike, Adaku C, NP      . hydrOXYzine (ATARAX/VISTARIL) tablet 25 mg  25 mg Oral TID PRN Anike, Adaku C, NP      . magnesium hydroxide (MILK OF MAGNESIA) suspension 30 mL  30 mL Oral Daily PRN Anike, Adaku C, NP      . OLANZapine zydis (ZYPREXA) disintegrating tablet 15 mg  15 mg Oral QHS Malvin Johns, MD      . traZODone (DESYREL) tablet 150 mg  150 mg Oral QHS PRN Malvin Johns, MD        Lab Results: No results found for this or any previous visit (from the past 48 hour(s)).  Blood Alcohol level:  Lab Results  Component Value Date   ETH <10 12/26/2019   ETH <5 07/13/2015    Metabolic Disorder Labs: Lab Results  Component Value Date   HGBA1C 5.5 12/26/2019   MPG 111.15 12/26/2019   Lab Results  Component Value Date   PROLACTIN 24.8 (H) 12/26/2019   Lab Results  Component Value Date   CHOL 134 12/26/2019   TRIG 46 12/26/2019   HDL 43 12/26/2019   CHOLHDL 3.1 12/26/2019   VLDL 9 12/26/2019   LDLCALC 82 12/26/2019    Physical Findings: AIMS: Facial and Oral Movements Muscles of Facial Expression: None, normal Lips and Perioral Area: None, normal Jaw: None, normal Tongue: None, normal,Extremity Movements Upper (arms, wrists, hands, fingers): None, normal Lower (legs, knees, ankles, toes): None, normal, Trunk Movements Neck, shoulders, hips: None, normal, Overall Severity Severity of abnormal movements (highest score from questions above): None, normal Incapacitation due to abnormal movements: None, normal Patient's awareness of abnormal movements (rate only patient's report): No Awareness, Dental Status Current problems with teeth and/or dentures?: No Does patient usually wear dentures?: No  CIWA:  CIWA-Ar Total: 0 COWS:  COWS Total Score:  1  Musculoskeletal: Strength & Muscle Tone: within normal limits Gait & Station: normal Patient leans: N/A  Psychiatric Specialty Exam: Physical Exam  Nursing note and vitals reviewed. Constitutional: He appears well-developed and well-nourished.    Review of Systems  Neurological: Negative.     Blood pressure (!) 98/53, pulse 98, temperature 98.3 F (36.8 C), temperature source Oral, resp. rate 16, height 5\' 9"  (1.753 m), SpO2 98 %.There is no height or weight on file to calculate BMI.  General Appearance: Casual  Eye Contact:  Fair  Speech:  Slow  Volume:  Decreased  Mood:  Dysphoric  Affect:  Constricted  Thought Process:  Irrelevant and Descriptions of Associations: Loose  Orientation:  Full (Time, Place, and Person)  Thought Content:  Hallucinations: Visual/delusions of being molested as a child, father reports this  is delusional  Suicidal Thoughts:  No  Homicidal Thoughts:  No  Memory:  Immediate;   Fair Recent;   Fair Remote;   Fair  Judgement:  Fair  Insight:  Shallow  Psychomotor Activity:  Normal  Concentration:  Concentration: Fair and Attention Span: Fair  Recall:  [employer] of Knowledge:  Fair  Language:  Fair  Akathisia:  Negative  Handed:  Right  AIMS (if indicated):     Assets:  Physical Health Resilience  ADL's:  Intact  Cognition:  WNL  Sleep:  Number of Hours: 7.5   Treatment Plan Summary: Daily contact with patient to assess and evaluate symptoms and progress in treatment and Medication management - For psychosis/schizophrenic disorder continue olanzapine but escalate the dose to 15 mg at bedtime Continue current precautions Patient denies wanting to harm himself no change in precautions as mentioned Continue to monitor/standard warnings basic risk-benefit side effects discussed    Wiatt Mahabir, MD 12/28/2019, 8:07 AM

## 2019-12-28 NOTE — Tx Team (Signed)
Interdisciplinary Treatment and Diagnostic Plan Update  12/28/2019 Time of Session: 9:00am  Alex Kline MRN: [id number]  Principal Diagnosis: <principal problem not specified>  Secondary Diagnoses: Active Problems:   Schizophrenia ([place])   Current Medications:  Current Facility-Administered Medications  Medication Dose Route Frequency Provider Last Rate Last Admin  . acetaminophen (TYLENOL) tablet 650 mg  650 mg Oral Q6H PRN Anike, Adaku C, NP      . alum & mag hydroxide-simeth (MAALOX/MYLANTA) 200-200-20 MG/5ML suspension 30 mL  30 mL Oral Q4H PRN Anike, Adaku C, NP      . hydrOXYzine (ATARAX/VISTARIL) tablet 25 mg  25 mg Oral TID PRN Anike, Adaku C, NP      . magnesium hydroxide (MILK OF MAGNESIA) suspension 30 mL  30 mL Oral Daily PRN Anike, Adaku C, NP      . OLANZapine zydis (ZYPREXA) disintegrating tablet 15 mg  15 mg Oral QHS Johnn Hai, MD      . traZODone (DESYREL) tablet 150 mg  150 mg Oral QHS PRN Johnn Hai, MD       PTA Medications: No medications prior to admission.    Patient Stressors:    Patient Strengths:    Treatment Modalities: Medication Management, Group therapy, Case management,  1 to 1 session with clinician, Psychoeducation, Recreational therapy.   Physician Treatment Plan for Primary Diagnosis: <principal problem not specified> Long Term Goal(s): Improvement in symptoms so as ready for discharge Improvement in symptoms so as ready for discharge   Short Term Goals: Ability to demonstrate self-control will improve Ability to identify and develop effective coping behaviors will improve Ability to maintain clinical measurements within normal limits will improve Compliance with prescribed medications will improve  Medication Management: Evaluate patient's response, side effects, and tolerance of medication regimen.  Therapeutic Interventions: 1 to 1 sessions, Unit Group sessions and Medication administration.  Evaluation of Outcomes: Not  Progressing  Physician Treatment Plan for Secondary Diagnosis: Active Problems:   Schizophrenia ([place])  Long Term Goal(s): Improvement in symptoms so as ready for discharge Improvement in symptoms so as ready for discharge   Short Term Goals: Ability to demonstrate self-control will improve Ability to identify and develop effective coping behaviors will improve Ability to maintain clinical measurements within normal limits will improve Compliance with prescribed medications will improve     Medication Management: Evaluate patient's response, side effects, and tolerance of medication regimen.  Therapeutic Interventions: 1 to 1 sessions, Unit Group sessions and Medication administration.  Evaluation of Outcomes: Not Progressing   RN Treatment Plan for Primary Diagnosis: <principal problem not specified> Long Term Goal(s): Knowledge of disease and therapeutic regimen to maintain health will improve  Short Term Goals: Ability to participate in decision making will improve, Ability to verbalize feelings will improve, Ability to disclose and discuss suicidal ideas, Ability to identify and develop effective coping behaviors will improve and Compliance with prescribed medications will improve  Medication Management: RN will administer medications as ordered by provider, will assess and evaluate patient's response and provide education to patient for prescribed medication. RN will report any adverse and/or side effects to prescribing provider.  Therapeutic Interventions: 1 on 1 counseling sessions, Psychoeducation, Medication administration, Evaluate responses to treatment, Monitor vital signs and CBGs as ordered, Perform/monitor CIWA, COWS, AIMS and Fall Risk screenings as ordered, Perform wound care treatments as ordered.  Evaluation of Outcomes: Not Progressing   LCSW Treatment Plan for Primary Diagnosis: <principal problem not specified> Long Term Goal(s): Safe transition to appropriate  next level  of care at discharge, Engage patient in therapeutic group addressing interpersonal concerns.  Short Term Goals: Engage patient in aftercare planning with referrals and resources and Increase skills for wellness and recovery  Therapeutic Interventions: Assess for all discharge needs, 1 to 1 time with Social worker, Explore available resources and support systems, Assess for adequacy in community support network, Educate family and significant other(s) on suicide prevention, Complete Psychosocial Assessment, Interpersonal group therapy.  Evaluation of Outcomes: Not Progressing   Progress in Treatment: Attending groups: Yes. Participating in groups: Yes. Taking medication as prescribed: Yes. Toleration medication: Yes. Family/Significant other contact made: Yes, individual(s) contacted:  pt's mother  Patient understands diagnosis: Yes. Discussing patient identified problems/goals with staff: Yes. Medical problems stabilized or resolved: Yes. Denies suicidal/homicidal ideation: Yes. Issues/concerns per patient self-inventory: No. Other:   New problem(s) identified: No, Describe:  none  New Short Term/Long Term Goal(s): Medication stabilization, elimination of SI thoughts, and development of a comprehensive mental wellness plan.   Patient Goals:  "go outside and get closer to my family"  Discharge Plan or Barriers: CSW will continue to follow up for appropriate referrals and possible discharge planning  Reason for Continuation of Hospitalization: Delusions  Hallucinations  Estimated Length of Stay: 3-5 days   Attendees: Patient: Alex Kline 12/28/2019 9:15 AM  Physician: Dr. Jeannine Kitten, MD 12/28/2019 9:15 AM  Nursing: Marisue Ivan RN 12/28/2019 9:15 AM  RN Care Manager: 12/28/2019 9:15 AM  Social Worker: Stephannie Peters, LCSW 12/28/2019 9:15 AM  Recreational Therapist:  12/28/2019 9:15 AM  Other: Earlyne Iba, MSW intern  12/28/2019 9:15 AM  Other:  12/28/2019 9:15 AM  Other: 12/28/2019  9:15 AM    Scribe for Treatment Team: Reynold Bowen, Student-Social Work 12/28/2019 9:15 AM

## 2019-12-28 NOTE — Progress Notes (Signed)
   12/28/19 2000  Psych Admission Type (Psych Patients Only)  Admission Status Voluntary  Psychosocial Assessment  Patient Complaints Anxiety  Eye Contact Fair  Facial Expression Flat  Affect Sad;Flat  Speech Soft  Interaction Guarded  Motor Activity Slow  Appearance/Hygiene Unremarkable  Behavior Characteristics Anxious  Mood Depressed  Thought Process  Coherency Unable to assess  Content Ambivalence  Delusions WDL  Perception Hallucinations  Hallucination Auditory  Judgment Impaired  Confusion None  Danger to Self  Current suicidal ideation? Denies  Self-Injurious Behavior No self-injurious ideation or behavior indicators observed or expressed   Agreement Not to Harm Self Yes  Description of Agreement Verbal  Danger to Others  Danger to Others None reported or observed  Danger to Others Abnormal  Harmful Behavior to others Threats of violence towards other people observed or expressed   Destructive Behavior No threats or harm toward property   Pt paranoid and disorganized at times . Pt suspicious at times.

## 2019-12-28 NOTE — Progress Notes (Deleted)
   12/28/19 1817  Charting Type  Charting Type Shift assessment  Orders Chart Check (once per shift) Completed  Safety Check Verification  Has the RN verified the 15 minute safety check completion? Yes  Neurological  Neuro (WDL) WDL  HEENT  HEENT (WDL) WDL  Respiratory  Respiratory (WDL) WDL  Cardiac  Cardiac (WDL) WDL  Vascular  Vascular (WDL) WDL  Integumentary  Integumentary (WDL) WDL (Tattoos/right&Left upper arm)  Braden Scale (Ages 8 and up)  Sensory Perceptions 4  Moisture 4  Activity 4  Mobility 4  Nutrition 3  Friction and Shear 3  Braden Scale Score 22  Musculoskeletal  Musculoskeletal (WDL) WDL  Gastrointestinal  Gastrointestinal (WDL) WDL  GU Assessment  Genitourinary (WDL) WDL   Pt paranoid and disorganized at times . Pt suspicious at times.

## 2019-12-29 MED ORDER — OLANZAPINE 10 MG PO TBDP
20.0000 mg | ORAL_TABLET | Freq: Every day | ORAL | Status: DC
  Administered 2019-12-29 – 2019-12-30 (×2): 20 mg via ORAL
  Filled 2019-12-29 (×3): qty 2

## 2019-12-29 NOTE — Progress Notes (Signed)
Pt appeared sedated on approach. No scheduled meds were administered to the pt this am. Pt denied SI/HI. Pt denied AVH. Pt noted to have mumbled, soft, slow speech and disorganized thoughts. Pt calm and cooperative this afternoon and no behavioral issues observed.   Medications reviewed. V/S assessed. Verbal support provided. 15 minute checks performed for safety.   Pt compliant with tx plan. Pt visible on the unit and attends scheduled groups.

## 2019-12-29 NOTE — Progress Notes (Addendum)
[facility] MD Progress Note  12/29/2019 11:38 AM Alex Kline  MRN:  [id number] Subjective:  "I'm good."  Alex Kline found sitting in the dayroom. He presents with constricted affect and some thought blocking. He reports adequate sleep overnight with 6.5 hours recorded. Per nursing report he has still been displaying sexually inappropriate behavior at times, and he reaches in his pants at one point during assessment but redirectable. He reports continuing auditory hallucinations of voices saying "What are you doing?" and "I'm here to haunt you." He also reports VH of demons. He does admit to continuing anger at his stepmother but denies homicidal ideation. When asked about paranoid ideation, he pauses before answering "maybe." He was switched from Risperdal to Zyprexa due to hypotension. Blood pressure this morning 125/54 with pulse 97. He denies SI/HI.   From admission H&P: This is the first admission at our facility for Mr. 27, 29 year old patient known to have a schizophrenic disorder, also known to be without treatment/medication for at least 6 months.  He presented on January 19 with a cluster of symptoms involving auditory and visual hallucinations, lack of full orientation, exposing himself inappropriately and displaying a sexual preoccupation, and clearly in need of inpatient stabilization.    Principal Problem: <principal problem not specified> Diagnosis: Active Problems:   Schizophrenia ([place])  Total Time spent with patient: 15 minutes  Past Psychiatric History: See admission H&P  Past Medical History:  Past Medical History:  Diagnosis Date  . Bipolar disorder ([place])   . Schizophrenia ([place])    History reviewed. No pertinent surgical history. Family History: History reviewed. No pertinent family history. Family Psychiatric  History: See admission H&P Social History:  Social History   Substance and Sexual Activity  Alcohol Use Yes   Comment: occ     Social History    Substance and Sexual Activity  Drug Use Yes  . Types: Marijuana    Social History   Socioeconomic History  . Marital status: Single    Spouse name: Not on file  . Number of children: Not on file  . Years of education: Not on file  . Highest education level: Not on file  Occupational History  . Not on file  Tobacco Use  . Smoking status: Current Every Day Smoker    Types: Cigarettes  . Smokeless tobacco: Never Used  Substance and Sexual Activity  . Alcohol use: Yes    Comment: occ  . Drug use: Yes    Types: Marijuana  . Sexual activity: Not on file  Other Topics Concern  . Not on file  Social History Narrative  . Not on file   Social Determinants of Health   Financial Resource Strain:   . Difficulty of Paying Living Expenses: Not on file  Food Insecurity:   . Worried About Charity fundraiser in the Last Year: Not on file  . Ran Out of Food in the Last Year: Not on file  Transportation Needs:   . Lack of Transportation (Medical): Not on file  . Lack of Transportation (Non-Medical): Not on file  Physical Activity:   . Days of Exercise per Week: Not on file  . Minutes of Exercise per Session: Not on file  Stress:   . Feeling of Stress : Not on file  Social Connections:   . Frequency of Communication with Friends and Family: Not on file  . Frequency of Social Gatherings with Friends and Family: Not on file  . Attends Religious Services: Not on file  .  Active Member of Clubs or Organizations: Not on file  . Attends Banker Meetings: Not on file  . Marital Status: Not on file   Additional Social History:                         Sleep: Good  Appetite:  Good  Current Medications: Current Facility-Administered Medications  Medication Dose Route Frequency Provider Last Rate Last Admin  . acetaminophen (TYLENOL) tablet 650 mg  650 mg Oral Q6H PRN Anike, Adaku C, NP      . alum & mag hydroxide-simeth (MAALOX/MYLANTA) 200-200-20 MG/5ML  suspension 30 mL  30 mL Oral Q4H PRN Anike, Adaku C, NP      . hydrOXYzine (ATARAX/VISTARIL) tablet 25 mg  25 mg Oral TID PRN Anike, Adaku C, NP   25 mg at 12/28/19 2054  . LORazepam (ATIVAN) tablet 2 mg  2 mg Oral Q4H PRN Malvin Johns, MD   2 mg at 12/28/19 1532   Or  . LORazepam (ATIVAN) injection 2 mg  2 mg Intramuscular Q4H PRN Malvin Johns, MD   2 mg at 12/28/19 1540  . magnesium hydroxide (MILK OF MAGNESIA) suspension 30 mL  30 mL Oral Daily PRN Anike, Adaku C, NP      . OLANZapine zydis (ZYPREXA) disintegrating tablet 15 mg  15 mg Oral QHS Malvin Johns, MD   15 mg at 12/28/19 2053  . traZODone (DESYREL) tablet 150 mg  150 mg Oral QHS PRN Malvin Johns, MD   150 mg at 12/28/19 2054    Lab Results: No results found for this or any previous visit (from the past 48 hour(s)).  Blood Alcohol level:  Lab Results  Component Value Date   ETH <10 12/26/2019   ETH <5 07/13/2015    Metabolic Disorder Labs: Lab Results  Component Value Date   HGBA1C 5.5 12/26/2019   MPG 111.15 12/26/2019   Lab Results  Component Value Date   PROLACTIN 24.8 (H) 12/26/2019   Lab Results  Component Value Date   CHOL 134 12/26/2019   TRIG 46 12/26/2019   HDL 43 12/26/2019   CHOLHDL 3.1 12/26/2019   VLDL 9 12/26/2019   LDLCALC 82 12/26/2019    Physical Findings: AIMS: Facial and Oral Movements Muscles of Facial Expression: None, normal Lips and Perioral Area: None, normal Jaw: None, normal Tongue: None, normal,Extremity Movements Upper (arms, wrists, hands, fingers): None, normal Lower (legs, knees, ankles, toes): None, normal, Trunk Movements Neck, shoulders, hips: None, normal, Overall Severity Severity of abnormal movements (highest score from questions above): None, normal Incapacitation due to abnormal movements: None, normal Patient's awareness of abnormal movements (rate only patient's report): No Awareness, Dental Status Current problems with teeth and/or dentures?: No Does patient  usually wear dentures?: No  CIWA:  CIWA-Ar Total: 1 COWS:  COWS Total Score: 2  Musculoskeletal: Strength & Muscle Tone: within normal limits Gait & Station: normal Patient leans: N/A  Psychiatric Specialty Exam: Physical Exam  Nursing note and vitals reviewed. Constitutional: He is oriented to person, place, and time. He appears well-developed and well-nourished.  Cardiovascular: Normal rate.  Respiratory: Effort normal.  Neurological: He is alert and oriented to person, place, and time.    Review of Systems  Constitutional: Negative.   Respiratory: Negative for cough and shortness of breath.   Psychiatric/Behavioral: Positive for behavioral problems and hallucinations. Negative for agitation, decreased concentration, dysphoric mood, self-injury, sleep disturbance and suicidal ideas. The patient is nervous/anxious. The  patient is not hyperactive.     Blood pressure (!) 125/54, pulse 97, temperature 98 F (36.7 C), temperature source Oral, resp. rate 16, height 5\' 9"  (1.753 m), SpO2 98 %.There is no height or weight on file to calculate BMI.  General Appearance: Fairly Groomed  Eye Contact:  Fair  Speech:  Slow  Volume:  Decreased  Mood:  Anxious  Affect:  Constricted  Thought Process:  Coherent  Orientation:  Full (Time, Place, and Person)  Thought Content:  Hallucinations: Auditory Visual and Tangential  Suicidal Thoughts:  No  Homicidal Thoughts:  No  Memory:  Immediate;   Fair Recent;   Fair  Judgement:  Impaired  Insight:  Lacking  Psychomotor Activity:  Normal  Concentration:  Concentration: Fair and Attention Span: Fair  Recall:  of Knowledge:  Fair  Language:  Fair  Akathisia:  No  Handed:  Right  AIMS (if indicated):     Assets:  Leisure Time Resilience Social Support  ADL's:  Intact  Cognition:  WNL  Sleep:  Number of Hours: 6.5     Treatment Plan Summary: Daily contact with patient to assess and evaluate symptoms and progress in  treatment and Medication management   Continue inpatient hospitalization.  Increase Zyprexa to 20 mg PO QHS for psychosis Continue Vistaril 25 mg PO TID PRN anxiety Continue trazodone 150 mg PO QHS PRN insomnia Continue Ativan IM or PO PRN agitation  Patient will participate in the therapeutic group milieu.  Discharge disposition in progress.   [employer], NP 12/29/2019, 11:38 AM   Agree with NP Note

## 2019-12-29 NOTE — [facility] Group Notes (Signed)
[facility] Group Notes: (Clinical Social Work)   12/29/2019      Type of Therapy:  Group Therapy   Participation Level:  Did Not Attend - was invited both individually by [facility] and by overhead announcement, chose not to attend.   Jerney Baksh Grossman-Orr, LCSW 12/29/2019, 12:58 PM    

## 2019-12-29 NOTE — Progress Notes (Signed)
   12/29/19 2105  COVID-19 Daily Checkoff  Have you had a fever (temp > 37.80C/100F)  in the past 24 hours?  No  If you have had runny nose, nasal congestion, sneezing in the past 24 hours, has it worsened? No  COVID-19 EXPOSURE  Have you traveled outside the state in the past 14 days? No  Have you been in contact with someone with a confirmed diagnosis of COVID-19 or PUI in the past 14 days without wearing appropriate PPE? No  Have you been living in the same home as a person with confirmed diagnosis of COVID-19 or a PUI (household contact)? No  Have you been diagnosed with COVID-19? No
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
## 2019-12-30 NOTE — Progress Notes (Signed)
   12/30/19 2011  Psych Admission Type (Psych Patients Only)  Admission Status Voluntary  Psychosocial Assessment  Patient Complaints None  Eye Contact Brief  Facial Expression Flat  Affect Inconsistent with thought content;Preoccupied  Speech Tangential;Slow;Soft  Interaction Childlike;Forwards little  Motor Activity Restless  Appearance/Hygiene Unremarkable  Behavior Characteristics Anxious;Restless  Mood Anxious  Thought Process  Coherency Circumstantial;Concrete thinking;Tangential  Content Preoccupation;Paranoia  Delusions Paranoid  Perception Hallucinations  Hallucination Auditory;Visual  Judgment Impaired  Confusion Mild  Danger to Self  Current suicidal ideation? Denies  Self-Injurious Behavior No self-injurious ideation or behavior indicators observed or expressed   Danger to Others  Danger to Others None reported or observed  Danger to Others Abnormal  Harmful Behavior to others No threats or harm toward other people  Destructive Behavior No threats or harm toward property

## 2019-12-30 NOTE — Progress Notes (Signed)
   12/30/19 2006  COVID-19 Daily Checkoff  Have you had a fever (temp > 37.80C/100F)  in the past 24 hours?  No  If you have had runny nose, nasal congestion, sneezing in the past 24 hours, has it worsened? No  COVID-19 EXPOSURE  Have you traveled outside the state in the past 14 days? No  Have you been in contact with someone with a confirmed diagnosis of COVID-19 or PUI in the past 14 days without wearing appropriate PPE? No  Have you been living in the same home as a person with confirmed diagnosis of COVID-19 or a PUI (household contact)? No  Have you been diagnosed with COVID-19? No    12/30/19 2006  COVID-19 Daily Checkoff  Have you had a fever (temp > 37.80C/100F)  in the past 24 hours?  No  If you have had runny nose, nasal congestion, sneezing in the past 24 hours, has it worsened? No  COVID-19 EXPOSURE  Have you traveled outside the state in the past 14 days? No  Have you been in contact with someone with a confirmed diagnosis of COVID-19 or PUI in the past 14 days without wearing appropriate PPE? No  Have you been living in the same home as a person with confirmed diagnosis of COVID-19 or a PUI (household contact)? No  Have you been diagnosed with COVID-19? No

## 2019-12-30 NOTE — Progress Notes (Signed)
Adult Psychoeducational Group Note  Date:  12/30/2019 Time:  9:05 PM  Group Topic/Focus: Wrap-Up Group:   The focus of this group is to help patients review their daily goal of treatment and discuss progress on daily workbooks.  Participation Level:  Minimal  Participation Quality:  Appropriate  Affect:  Flat  Cognitive:  Oriented  Insight: Appropriate  Engagement in Group:  Engaged  Modes of Intervention:  Education and Support  Additional Comments:  Patient attended and participated in group tonight. He reports his day as being up and down. He spoke with his nurse and went outside. He played cards with peers.  Lita Mains [facility] 12/30/2019, 9:05 PM

## 2019-12-30 NOTE — Progress Notes (Addendum)
[facility] MD Progress Note  12/30/2019 12:09 PM Alex Kline  MRN:  [id number] Subjective:  "I feel good."  Alex Kline found sitting in the dayroom. He presents with flat affect. Per nursing report he continues to present with sexually inappropriate behaviors at times but has been redirectable. He continues to present with disorganized speech. He reports stable mood and denies HI. He specifically denies HI toward his stepmother or grandmother. He reports speaking on the phone with his grandmother yesterday. He reports VH of demons last night but denies AVH today. He shows no signs of paranoia or responding to internal stimuli. He is med compliant.  From admission H&P: This is the first admission at our facility for Mr. 55, 29 year old patient known to have a schizophrenic disorder, also known to be without treatment/medication for at least 6 months. He presented on January 19 with a cluster of symptoms involving auditory and visual hallucinations, lack of full orientation, exposing himself inappropriately and displaying a sexual preoccupation, and clearly in need of inpatient stabilization.   Principal Problem: <principal problem not specified> Diagnosis: Active Problems:   Schizophrenia ([place])  Total Time spent with patient: 15 minutes  Past Psychiatric History: See admission H&P  Past Medical History:  Past Medical History:  Diagnosis Date  . Bipolar disorder ([place])   . Schizophrenia ([place])    History reviewed. No pertinent surgical history. Family History: History reviewed. No pertinent family history. Family Psychiatric  History: See admission H&P Social History:  Social History   Substance and Sexual Activity  Alcohol Use Yes   Comment: occ     Social History   Substance and Sexual Activity  Drug Use Yes  . Types: Marijuana    Social History   Socioeconomic History  . Marital status: Single    Spouse name: Not on file  . Number of children: Not on file  . Years of  education: Not on file  . Highest education level: Not on file  Occupational History  . Not on file  Tobacco Use  . Smoking status: Current Every Day Smoker    Types: Cigarettes  . Smokeless tobacco: Never Used  Substance and Sexual Activity  . Alcohol use: Yes    Comment: occ  . Drug use: Yes    Types: Marijuana  . Sexual activity: Not on file  Other Topics Concern  . Not on file  Social History Narrative  . Not on file   Social Determinants of Health   Financial Resource Strain:   . Difficulty of Paying Living Expenses: Not on file  Food Insecurity:   . Worried About Charity fundraiser in the Last Year: Not on file  . Ran Out of Food in the Last Year: Not on file  Transportation Needs:   . Lack of Transportation (Medical): Not on file  . Lack of Transportation (Non-Medical): Not on file  Physical Activity:   . Days of Exercise per Week: Not on file  . Minutes of Exercise per Session: Not on file  Stress:   . Feeling of Stress : Not on file  Social Connections:   . Frequency of Communication with Friends and Family: Not on file  . Frequency of Social Gatherings with Friends and Family: Not on file  . Attends Religious Services: Not on file  . Active Member of Clubs or Organizations: Not on file  . Attends Archivist Meetings: Not on file  . Marital Status: Not on file   Additional Social History:  Sleep: Good  Appetite:  Good  Current Medications: Current Facility-Administered Medications  Medication Dose Route Frequency Provider Last Rate Last Admin  . acetaminophen (TYLENOL) tablet 650 mg  650 mg Oral Q6H PRN Anike, Adaku C, NP      . alum & mag hydroxide-simeth (MAALOX/MYLANTA) 200-200-20 MG/5ML suspension 30 mL  30 mL Oral Q4H PRN Anike, Adaku C, NP      . hydrOXYzine (ATARAX/VISTARIL) tablet 25 mg  25 mg Oral TID PRN Anike, Adaku C, NP   25 mg at 12/29/19 2102  . LORazepam (ATIVAN) tablet 2 mg  2 mg Oral Q4H PRN  Malvin Johns, MD   2 mg at 12/30/19 0150   Or  . LORazepam (ATIVAN) injection 2 mg  2 mg Intramuscular Q4H PRN Malvin Johns, MD   2 mg at 12/28/19 1540  . magnesium hydroxide (MILK OF MAGNESIA) suspension 30 mL  30 mL Oral Daily PRN Anike, Adaku C, NP      . OLANZapine zydis (ZYPREXA) disintegrating tablet 20 mg  20 mg Oral QHS Aldean Baker, NP   20 mg at 12/29/19 2102  . traZODone (DESYREL) tablet 150 mg  150 mg Oral QHS PRN Malvin Johns, MD   150 mg at 12/29/19 2102    Lab Results: No results found for this or any previous visit (from the past 48 hour(s)).  Blood Alcohol level:  Lab Results  Component Value Date   ETH <10 12/26/2019   ETH <5 07/13/2015    Metabolic Disorder Labs: Lab Results  Component Value Date   HGBA1C 5.5 12/26/2019   MPG 111.15 12/26/2019   Lab Results  Component Value Date   PROLACTIN 24.8 (H) 12/26/2019   Lab Results  Component Value Date   CHOL 134 12/26/2019   TRIG 46 12/26/2019   HDL 43 12/26/2019   CHOLHDL 3.1 12/26/2019   VLDL 9 12/26/2019   LDLCALC 82 12/26/2019    Physical Findings: AIMS: Facial and Oral Movements Muscles of Facial Expression: None, normal Lips and Perioral Area: None, normal Jaw: None, normal Tongue: None, normal,Extremity Movements Upper (arms, wrists, hands, fingers): None, normal Lower (legs, knees, ankles, toes): None, normal, Trunk Movements Neck, shoulders, hips: None, normal, Overall Severity Severity of abnormal movements (highest score from questions above): None, normal Incapacitation due to abnormal movements: None, normal Patient's awareness of abnormal movements (rate only patient's report): No Awareness, Dental Status Current problems with teeth and/or dentures?: No Does patient usually wear dentures?: No  CIWA:  CIWA-Ar Total: 1 COWS:  COWS Total Score: 2  Musculoskeletal: Strength & Muscle Tone: within normal limits Gait & Station: normal Patient leans: N/A  Psychiatric Specialty  Exam: Physical Exam  Nursing note and vitals reviewed. Constitutional: He is oriented to person, place, and time. He appears well-developed and well-nourished.  Cardiovascular: Normal rate.  Respiratory: Effort normal.  Neurological: He is alert and oriented to person, place, and time.    Review of Systems  Constitutional: Negative.   Respiratory: Negative for cough and shortness of breath.   Psychiatric/Behavioral: Positive for behavioral problems. Negative for agitation, dysphoric mood, hallucinations, self-injury, sleep disturbance and suicidal ideas. The patient is not nervous/anxious and is not hyperactive.     Blood pressure (!) 153/83, pulse 97, temperature 98.2 F (36.8 C), temperature source Oral, resp. rate 18, height 5\' 9"  (1.753 m), SpO2 100 %.There is no height or weight on file to calculate BMI.  General Appearance: Casual  Eye Contact:  Fair  Speech:  Slow  Volume:  Decreased  Mood:  Euthymic  Affect:  Flat  Thought Process:  Disorganized  Orientation:  Full (Time, Place, and Person)  Thought Content:  Tangential  Suicidal Thoughts:  No  Homicidal Thoughts:  No  Memory:  Immediate;   Fair Recent;   Fair  Judgement:  Fair  Insight:  Lacking  Psychomotor Activity:  Normal  Concentration:  Concentration: Fair and Attention Span: Fair  Recall:  [employer] of Knowledge:  Fair  Language:  Good  Akathisia:  No  Handed:  Right  AIMS (if indicated):     Assets:  Housing Leisure Time Physical Health Social Support  ADL's:  Intact  Cognition:  WNL  Sleep:  Number of Hours: 2.5     Treatment Plan Summary: Daily contact with patient to assess and evaluate symptoms and progress in treatment and Medication management   Continue inpatient hospitalization.  Continue Zyprexa 20 mg PO QHS for psychosis Continue Vistaril 25 mg PO TID PRN anxiety Continue trazodone 150 mg PO QHS PRN insomnia Continue Ativan IM or PO PRN agitation  Patient will participate in the  therapeutic group milieu.  Discharge disposition in progress.   Aldean Baker, NP 12/30/2019, 12:09 PM   Agree with NP Note

## 2019-12-30 NOTE — Progress Notes (Signed)
Writer went to patients room after hearing a banging sound. When asked patient what the noise was he reported that he was banging his head on the floor. When asked why he was doing that he gave writer no response and just stared at me. He was encouraged to lie down and get some rest.  A minutes later the banging started again and writer asked why he was still doing the banging he replied that he couldn't sleep. NP notified and was advised to monitor patient because he has received quite a bit of medications already or place in observation room if he continues. He later came to nursing station requesting pain medicine and received tylenol for headache. He was asked to go to the observation room so that he could be monitored on camera but declined. Will continue to monitor patient.

## 2019-12-30 NOTE — Progress Notes (Signed)
Pt presents with a flat affect, slow interaction and disorganized thoughts. When asked if the pt is having any AVH the pt stated "last night I saw two to three monsters." Pt then stated "why every time I see a black woman she think I'm sick." Pt continues to be sexually inappropriate with male staff and requires redirecting for inappropriate, childlike behaviors.  Orders reviewed. V/s assessed. Verbal support provided. 15 minute checks performed for safety.  Pt compliant with taking meds. No side ef fects observed or verbalized by the pt.     12/30/19 0800  Psych Admission Type (Psych Patients Only)  Admission Status Voluntary  Psychosocial Assessment  Patient Complaints None  Eye Contact Brief  Facial Expression Flat  Affect Inconsistent with thought content;Preoccupied  Speech Tangential;Slow;Soft  Interaction Childlike;Forwards little  Motor Activity Slow;Restless  Appearance/Hygiene Unremarkable  Behavior Characteristics Hypersexual;Intrusive  Mood Preoccupied  Thought Process  Coherency Circumstantial;Concrete thinking;Tangential  Content Preoccupation;Paranoia  Delusions Paranoid  Perception Hallucinations  Hallucination Auditory;Visual  Judgment Impaired  Confusion Mild  Danger to Self  Current suicidal ideation? Denies  Self-Injurious Behavior No self-injurious ideation or behavior indicators observed or expressed   Danger to Others  Danger to Others None reported or observed  Danger to Others Abnormal  Harmful Behavior to others No threats or harm toward other people  Destructive Behavior No threats or harm toward property

## 2019-12-30 NOTE — Progress Notes (Signed)
Pt agitated this evening, pacing and cursing. Pt came up to the nurses station stating that someone stole his book. Pt was asked to calm down. The pt then stated "give me my meds." the pt was given Ativan for agitation.

## 2019-12-30 NOTE — [facility] Group Notes (Signed)
Adult Psychoeducational Group Note  Date:  12/30/2019 Time:  2:34 AM  Group Topic/Focus:  Wrap-Up Group:   The focus of this group is to help patients review their daily goal of treatment and discuss progress on daily workbooks.  Participation Level:  Active  Participation Quality:  Appropriate  Affect:  Appropriate  Cognitive:  Appropriate  Insight: Improving  Engagement in Group:  Engaged  Modes of Intervention:  Support  Additional Comments:  Pt attended and participated in wrap up group this evening and rated their day a 4/10. Pt was able to go outside but told writer "they were trying to play me, so I came back in". Writer asked pt to explain their statement, but told writer they didn't know who was trying to play them or what they meat by that. Pt goal was to play basketball and to interact with their peers. Pt goal for tomorrow is to "have a baby". Writer asked pt to explain how they were going to do this. Pt stated that they would go to sleep and then wake up and they would have a baby. Pt was asked if they are seeing or hearing anything that isn't there, pt replied "Im seeing the flaws in everyone". Pt did not elaborate on that statement.    Chrisandra Netters 12/30/2019, 2:34 AM

## 2019-12-30 NOTE — Progress Notes (Signed)
Pt gave this writer consent to call his sister Zenon Mayo) and provide her with his code number, the details regarding his treatment plan and the clothing policy. Writer reviewed with Zenon Mayo the pt's scheduled medications along with the clothing policy. Zenon Mayo is requesting to speak with the pt's doctor. Writer informed the pt's sister that Dr. Jeannine Kitten will return on Monday.

## 2019-12-30 NOTE — [facility] Group Notes (Signed)
[facility] LCSW Group Therapy Note  Date/Time:  12/30/2019  11:00AM-12:00PM  Type of Therapy and Topic:  Group Therapy:  Music and Mood  Participation Level:  Active   Description of Group: In this process group, members listened to a variety of genres of music and identified that different types of music evoke different responses.  Patients were encouraged to identify music that was soothing for them and music that was energizing for them.  Patients discussed how this knowledge can help with wellness and recovery in various ways including managing depression and anxiety as well as encouraging healthy sleep habits.    Therapeutic Goals: Patients will explore the impact of different varieties of music on mood Patients will verbalize the thoughts they have when listening to different types of music Patients will identify music that is soothing to them as well as music that is energizing to them Patients will discuss how to use this knowledge to assist in maintaining wellness and recovery Patients will explore the use of music as a coping skill  Summary of Patient Progress:  At the beginning of group, patient expressed that he felt "awesome."  He was in and out of the room during group and when present, moved from one chair to another frequently.  At the end of group, patient expressed "the tables are turned."  He did express positive reactions to some of the songs.    Therapeutic Modalities: Solution Focused Brief Therapy Activity   Ambrose Mantle, LCSW

## 2019-12-31 MED ORDER — HALOPERIDOL 5 MG PO TABS
5.0000 mg | ORAL_TABLET | Freq: Two times a day (BID) | ORAL | Status: DC
  Administered 2019-12-31 – 2020-01-01 (×3): 5 mg via ORAL
  Filled 2019-12-31 (×6): qty 1

## 2019-12-31 MED ORDER — DIVALPROEX SODIUM 250 MG PO DR TAB
250.0000 mg | DELAYED_RELEASE_TABLET | Freq: Three times a day (TID) | ORAL | Status: DC
  Administered 2019-12-31 – 2020-01-02 (×7): 250 mg via ORAL
  Filled 2019-12-31 (×11): qty 1

## 2019-12-31 MED ORDER — OLANZAPINE 10 MG PO TBDP: 20.0000 mg | ORAL_TABLET | Freq: Every day | ORAL | Status: DC

## 2019-12-31 MED ORDER — OLANZAPINE 5 MG PO TBDP: 15.0000 mg | ORAL_TABLET | Freq: Every day | ORAL | Status: DC

## 2019-12-31 MED ORDER — BENZTROPINE MESYLATE 0.5 MG PO TABS
0.5000 mg | ORAL_TABLET | Freq: Two times a day (BID) | ORAL | Status: DC
  Administered 2019-12-31 – 2020-01-02 (×5): 0.5 mg via ORAL
  Filled 2019-12-31 (×8): qty 1

## 2019-12-31 MED ORDER — TRAZODONE HCL 100 MG PO TABS
100.0000 mg | ORAL_TABLET | Freq: Every evening | ORAL | Status: DC | PRN
  Administered 2019-12-31 – 2020-01-01 (×2): 100 mg via ORAL
  Filled 2019-12-31 (×2): qty 1

## 2019-12-31 NOTE — Progress Notes (Signed)
Recreation Therapy Notes  Date: 1.25.21 Time: 1000 Location: 500 Hall  Group Topic: Goal Setting  Goal Area(s) Addresses:  Patient will be able to identify at least 3 life goals.  Patient will be able to identify benefit of investing in life goals.  Patient will be able to identify benefit of setting life goals.   Behavioral Response:  Engaged  Intervention: Worksheet  Activity: Garment/textile technologist.  Patients were to identify goals they hoped to accomplish in a week, month, year and five years.  Patients were to then to identify obstacles to reaching goals, what they need to achieve goals and what they can start doing now to work towards goals.  Education:  Discharge Planning, Pharmacologist, Leisure Education   Education Outcome: Acknowledges Education/In Group Clarification Provided/Needs Additional Education  Clinical Observations:  Pt stated wanting to sleep within the next week, humble himself in the next month, reach a new year in the next year and be a better man in five years.  Pt identified obstacles to reaching goals were not behaving.  Pt expressed needing a car, house and money to achieve goals.  Pt didn't identify anything he could start doing tomorrow to work towards goal.    Caroll Rancher, LRT/CTRS     Caroll Rancher A 12/31/2019 10:56 AM

## 2019-12-31 NOTE — Progress Notes (Addendum)
[facility] MD Progress Note  12/31/2019 8:04 AM Alex Kline  MRN:  [id number] Subjective:    Patient had a difficult night he was described as "banging his head on the floor" however he has no obvious trauma no current headache or neurological findings he is sluggish oriented to person place situation not exact date.  He states he wants [place] to find him "a boarding home" however he does have a place to stay and states he does not need housing so he is giving contradictory information  No EPS or TD Describes still a sexual inappropriate with male staff as of last evening Principal Problem: Chronic untreated and undertreated schizophrenia Diagnosis: Active Problems:   Schizophrenia ([facility])  Total Time spent with patient: 20 minutes  Past Psychiatric History: See eval  Past Medical History:  Past Medical History:  Diagnosis Date  . Bipolar disorder ([facility])   . Schizophrenia ([facility])    History reviewed. No pertinent surgical history. Family History: History reviewed. No pertinent family history. Family Psychiatric  History: See eval Social History:  Social History   Substance and Sexual Activity  Alcohol Use Yes   Comment: occ     Social History   Substance and Sexual Activity  Drug Use Yes  . Types: Marijuana    Social History   Socioeconomic History  . Marital status: Single    Spouse name: Not on file  . Number of children: Not on file  . Years of education: Not on file  . Highest education level: Not on file  Occupational History  . Not on file  Tobacco Use  . Smoking status: Current Every Day Smoker    Types: Cigarettes  . Smokeless tobacco: Never Used  Substance and Sexual Activity  . Alcohol use: Yes    Comment: occ  . Drug use: Yes    Types: Marijuana  . Sexual activity: Not on file  Other Topics Concern  . Not on file  Social History Narrative  . Not on file   Social Determinants of Health   Financial Resource Strain:   . Difficulty of Paying Living  Expenses: Not on file  Food Insecurity:   . Worried About Programme researcher, broadcasting/film/video in the Last Year: Not on file  . Ran Out of Food in the Last Year: Not on file  Transportation Needs:   . Lack of Transportation (Medical): Not on file  . Lack of Transportation (Non-Medical): Not on file  Physical Activity:   . Days of Exercise per Week: Not on file  . Minutes of Exercise per Session: Not on file  Stress:   . Feeling of Stress : Not on file  Social Connections:   . Frequency of Communication with Friends and Family: Not on file  . Frequency of Social Gatherings with Friends and Family: Not on file  . Attends Religious Services: Not on file  . Active Member of Clubs or Organizations: Not on file  . Attends Banker Meetings: Not on file  . Marital Status: Not on file   Additional Social History:                         Sleep: Good  Appetite:  Good  Current Medications: Current Facility-Administered Medications  Medication Dose Route Frequency Provider Last Rate Last Admin  . acetaminophen (TYLENOL) tablet 650 mg  650 mg Oral Q6H PRN Anike, Adaku C, NP   650 mg at 12/30/19 2225  . alum &  mag hydroxide-simeth (MAALOX/MYLANTA) 200-200-20 MG/5ML suspension 30 mL  30 mL Oral Q4H PRN Anike, Adaku C, NP      . divalproex (DEPAKOTE SPRINKLE) capsule 250 mg  250 mg Oral Q8H Johnn Hai, MD      . hydrOXYzine (ATARAX/VISTARIL) tablet 25 mg  25 mg Oral TID PRN Anike, Adaku C, NP   25 mg at 12/30/19 2054  . LORazepam (ATIVAN) tablet 2 mg  2 mg Oral Q4H PRN Johnn Hai, MD   2 mg at 12/30/19 1903   Or  . LORazepam (ATIVAN) injection 2 mg  2 mg Intramuscular Q4H PRN Johnn Hai, MD   2 mg at 12/28/19 1540  . magnesium hydroxide (MILK OF MAGNESIA) suspension 30 mL  30 mL Oral Daily PRN Anike, Adaku C, NP      . OLANZapine zydis (ZYPREXA) disintegrating tablet 20 mg  20 mg Oral QHS Johnn Hai, MD      . traZODone (DESYREL) tablet 100 mg  100 mg Oral QHS PRN Johnn Hai, MD         Lab Results: No results found for this or any previous visit (from the past 48 hour(s)).  Blood Alcohol level:  Lab Results  Component Value Date   ETH <10 12/26/2019   ETH <5 09/32/6712    Metabolic Disorder Labs: Lab Results  Component Value Date   HGBA1C 5.5 12/26/2019   MPG 111.15 12/26/2019   Lab Results  Component Value Date   PROLACTIN 24.8 (H) 12/26/2019   Lab Results  Component Value Date   CHOL 134 12/26/2019   TRIG 46 12/26/2019   HDL 43 12/26/2019   CHOLHDL 3.1 12/26/2019   VLDL 9 12/26/2019   LDLCALC 82 12/26/2019    Physical Findings: AIMS: Facial and Oral Movements Muscles of Facial Expression: None, normal Lips and Perioral Area: None, normal Jaw: None, normal Tongue: None, normal,Extremity Movements Upper (arms, wrists, hands, fingers): None, normal Lower (legs, knees, ankles, toes): None, normal, Trunk Movements Neck, shoulders, hips: None, normal, Overall Severity Severity of abnormal movements (highest score from questions above): None, normal Incapacitation due to abnormal movements: None, normal Patient's awareness of abnormal movements (rate only patient's report): No Awareness, Dental Status Current problems with teeth and/or dentures?: No Does patient usually wear dentures?: No  CIWA:  CIWA-Ar Total: 1 COWS:  COWS Total Score: 2  Musculoskeletal: Strength & Muscle Tone: within normal limits Gait & Station: normal Patient leans: N/A  Psychiatric Specialty Exam: Physical Exam  Review of Systems  Blood pressure (!) 153/83, pulse 97, temperature 98.2 F (36.8 C), temperature source Oral, resp. rate 18, height 5\' 9"  (1.753 m), SpO2 100 %.There is no height or weight on file to calculate BMI.  General Appearance: Disheveled  Eye Contact:  Fair  Speech:  Slow  Volume:  Decreased  Mood:  Dysphoric  Affect:  Blunt  Thought Process:  Irrelevant and Descriptions of Associations: Circumstantial  Orientation:  Full (Time, Place, and  Person)  Thought Content:  Illogical  Suicidal Thoughts:  No  Homicidal Thoughts:  No  Memory:  Immediate;   Fair Recent;   Fair Remote;   Fair  Judgement:  Poor  Insight:  Shallow  Psychomotor Activity:  Decreased  Concentration:  Concentration: Fair and Attention Span: Fair  Recall:  [employer] of Knowledge:  Fair  Language:  Fair  Akathisia:  Negative  Handed:  Right  AIMS (if indicated):     Assets:  Physical Health Resilience Social Support  ADL's:  Intact  Cognition:  WNL  Sleep:  Number of Hours: 4     Treatment Plan Summary: Daily contact with patient to assess and evaluate symptoms and progress in treatment  Switched to Haldol due to lack of response to risperidone and now Zyprexa Add Depakote Continue reality based therapy Clarify living situation with father No change in precautions  Malvin Johns, MD 12/31/2019, 8:04 AM

## 2019-12-31 NOTE — Progress Notes (Signed)
   12/31/19 2000  Psych Admission Type (Psych Patients Only)  Admission Status Voluntary  Psychosocial Assessment  Patient Complaints None  Eye Contact Brief  Facial Expression Flat  Affect Inconsistent with thought content;Preoccupied  Speech Tangential;Slow;Soft  Interaction Childlike;Forwards little  Motor Activity Restless  Appearance/Hygiene Unremarkable  Behavior Characteristics Restless  Mood Depressed;Anxious  Thought Process  Coherency Circumstantial;Concrete thinking;Tangential  Content Preoccupation;Paranoia  Delusions Paranoid  Perception Hallucinations  Hallucination Auditory;Visual  Judgment Impaired  Confusion Mild  Danger to Self  Current suicidal ideation? Denies  Self-Injurious Behavior No self-injurious ideation or behavior indicators observed or expressed   Danger to Others  Danger to Others None reported or observed  Danger to Others Abnormal  Harmful Behavior to others No threats or harm toward other people  Destructive Behavior No threats or harm toward property

## 2019-12-31 NOTE — [facility] Group Notes (Signed)
[facility] LCSW Group Therapy Note  Date/Time: 12/31/2019 @ 1:30pm  Type of Therapy/Topic:  Group Therapy:  Feelings about Diagnosis  Participation Level:  Active   Mood: Pleasant    Description of Group:    This group will allow patients to explore their thoughts and feelings about diagnoses they have received. Patients will be guided to explore their level of understanding and acceptance of these diagnoses. Facilitator will encourage patients to process their thoughts and feelings about the reactions of others to their diagnosis, and will guide patients in identifying ways to discuss their diagnosis with significant others in their lives. This group will be process-oriented, with patients participating in exploration of their own experiences as well as giving and receiving support and challenge from other group members.   Therapeutic Goals: 1. Patient will demonstrate understanding of diagnosis as evidence by identifying two or more symptoms of the disorder:  2. Patient will be able to express two feelings regarding the diagnosis 3. Patient will demonstrate ability to communicate their needs through discussion and/or role plays  Summary of Patient Progress:   Patient was active and engaged in group therapy today. Patient was able to discuss how he felt when he was diagnosed with a mental illness. Patient stated that he was messed up and that he never thought he would have a mental health diagnosis.      Therapeutic Modalities:   Cognitive Behavioral Therapy Brief Therapy Feelings Identification   Stephannie Peters, LCSW

## 2019-12-31 NOTE — Progress Notes (Signed)
D:  Patient's self inventory sheet, patient has fair sleep, sleep medication helpful.  Poor appetite, normal energy level, good concentration.  Rated depression, anxiety, hopeless #6.  Denied SI.  Physical problems, lightheaded. Headache.  Painful foot, worst pain #10.  No discharge plans. A:  Medications administered per MD orders.  Emotional support and encouragement given patient. R:  Denied SI and HI, contracts for safety.  Safety maintained with 15 minute checks.

## 2019-12-31 NOTE — Plan of Care (Signed)
Nurse discussed anxiety, depression and coping skills with patient.  

## 2020-01-01 MED ORDER — HALOPERIDOL 5 MG PO TABS
5.0000 mg | ORAL_TABLET | Freq: Every day | ORAL | Status: DC
  Administered 2020-01-02: 5 mg via ORAL
  Filled 2020-01-01: qty 1

## 2020-01-01 MED ORDER — TOPIRAMATE 25 MG PO TABS
50.0000 mg | ORAL_TABLET | Freq: Two times a day (BID) | ORAL | Status: DC
  Administered 2020-01-01 – 2020-01-02 (×3): 50 mg via ORAL
  Filled 2020-01-01 (×5): qty 2

## 2020-01-01 MED ORDER — HALOPERIDOL 5 MG PO TABS
10.0000 mg | ORAL_TABLET | Freq: Every day | ORAL | Status: DC
  Administered 2020-01-01: 21:00:00 10 mg via ORAL
  Filled 2020-01-01 (×2): qty 2

## 2020-01-01 NOTE — Progress Notes (Signed)
Recreation Therapy Notes  Date: 1.26.21 Time: 1000 Location: 500 Hall Dayroom  Group Topic: Coping Skills  Goal Area(s) Addresses:  Patient will identify healthy and unhealthy coping strategies. Patient will identify benefit of using healthy coping strategies.  Behavioral Response: Engaged  Intervention: Worksheet  Activity: Healthy vs. Unhealthy Coping Strategies.  Patients were to identify a current problem they are faced with, unhealthy coping strategies they have used to address the problem and the consequences of those coping strategies.  Patients then identified healthy coping strategies they can use, expected outcomes and what stops them from using the positive coping strategies.  Education: Pharmacologist, Building control surveyor.   Education Outcome: Acknowledges understanding/In group clarification offered/Needs additional education.   Clinical Observations/Feedback: Pt identified current problem as family issues.  Pt expressed negative coping skills are not doing what he's supposed to do and doing the opposite for everything.  Pt identified positive coping skills as reading, looking and listening.  Pt expects things to get better if he uses his positive coping strategies.    Caroll Rancher, LRT/CTRS    Caroll Rancher A 01/01/2020 11:39 AM

## 2020-01-01 NOTE — Progress Notes (Signed)
   01/01/20 2000  Psych Admission Type (Psych Patients Only)  Admission Status Voluntary  Psychosocial Assessment  Patient Complaints Sleep disturbance  Eye Contact Brief  Facial Expression Flat  Affect Inconsistent with thought content;Preoccupied  Speech Tangential;Slow;Soft  Interaction Hypervigilant  Motor Activity Restless  Appearance/Hygiene Unremarkable  Behavior Characteristics Restless  Mood Anxious  Thought Process  Coherency Circumstantial;Concrete thinking;Tangential  Content Preoccupation;Paranoia  Delusions Paranoid  Perception Hallucinations  Hallucination Auditory;Visual  Judgment Impaired  Confusion Mild  Danger to Self  Current suicidal ideation? Denies  Self-Injurious Behavior No self-injurious ideation or behavior indicators observed or expressed   Danger to Others  Danger to Others None reported or observed  Danger to Others Abnormal  Harmful Behavior to others No threats or harm toward other people  Destructive Behavior No threats or harm toward property

## 2020-01-01 NOTE — Progress Notes (Signed)
D- Pt denied HI/SI.  Pt stated that he continues to have auditory and visual hallucinations.   Per self inventory sheet, pt rated depression as a 7/10, hopelessness 0/10, and anxiety 7/10.  A- RN establlished rapport with pt and assessed for needs/concerns.  RN provided encouragement and validated pt's feelings.  Administered meds per MD order.  Maintained safety through q 15 min safety checks. R - Pt stated his goal was to connect with his peers today on the unit. Pt has been spending time in the dayroom today.  Pt's thoughts have been disorganized/scattered so far this shift.  Pt remains safe on the unit. RN will continue to monitor pt's progress and provide support as needed.      01/01/20 0945  Psych Admission Type (Psych Patients Only)  Admission Status Voluntary  Psychosocial Assessment  Patient Complaints Sleep disturbance  Eye Contact Brief  Facial Expression Flat  Affect Inconsistent with thought content;Preoccupied  Speech Tangential;Slow;Soft  Interaction Hypervigilant  Motor Activity Restless  Appearance/Hygiene Unremarkable  Behavior Characteristics Restless  Mood Anxious  Thought Process  Coherency Circumstantial;Concrete thinking;Tangential  Content Preoccupation;Paranoia  Delusions Paranoid  Perception Hallucinations  Hallucination Auditory;Visual  Judgment Impaired  Confusion Mild  Danger to Self  Current suicidal ideation? Denies  Self-Injurious Behavior No self-injurious ideation or behavior indicators observed or expressed   Danger to Others  Danger to Others None reported or observed  Danger to Others Abnormal  Harmful Behavior to others No threats or harm toward other people  Destructive Behavior No threats or harm toward property
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
## 2020-01-02 MED ORDER — HALOPERIDOL DECANOATE 100 MG/ML IM SOLN: 150.0000 mg | INTRAMUSCULAR | 11 refills | Status: DC

## 2020-01-02 MED ORDER — HALOPERIDOL 5 MG PO TABS: 15.0000 mg | ORAL_TABLET | Freq: Every day | ORAL | 2 refills | Status: DC

## 2020-01-02 MED ORDER — DIVALPROEX SODIUM 250 MG PO DR TAB: 250.0000 mg | DELAYED_RELEASE_TABLET | Freq: Three times a day (TID) | ORAL | 2 refills | Status: DC

## 2020-01-02 MED ORDER — HALOPERIDOL DECANOATE 100 MG/ML IM SOLN
150.0000 mg | INTRAMUSCULAR | Status: DC
  Administered 2020-01-02: 12:00:00 150 mg via INTRAMUSCULAR
  Filled 2020-01-02: qty 1.5
  Filled 2020-01-02: qty 2

## 2020-01-02 MED ORDER — BENZTROPINE MESYLATE 1 MG PO TABS
1.0000 mg | ORAL_TABLET | Freq: Two times a day (BID) | ORAL | Status: DC
  Filled 2020-01-02 (×3): qty 14

## 2020-01-02 MED ORDER — BENZTROPINE MESYLATE 1 MG PO TABS: 1.0000 mg | ORAL_TABLET | Freq: Two times a day (BID) | ORAL | 2 refills | Status: DC

## 2020-01-02 MED ORDER — HALOPERIDOL 5 MG PO TABS
15.0000 mg | ORAL_TABLET | Freq: Every day | ORAL | Status: DC
  Filled 2020-01-02: qty 21

## 2020-01-02 NOTE — Progress Notes (Signed)
  [facility] Adult Case Management Discharge Plan :  Will you be returning to the same living situation after discharge:  Yes,  home At discharge, do you have transportation home?: Yes,  [facility] Do you have the ability to pay for your medications: No.; [facility]  Release of information consent forms completed and in the chart;  Patient's signature needed at discharge.  Patient to Follow up at: Follow-up Information    [facility] Follow up on 01/03/2020.   Why: You are scheduled for an appointment on 01/03/20 at 9:00 am.  This appointment will be held via telephone.  Please have your insurance information and your discharge summary from this hospitalization available. Contact information: [address] [place] [address] [phone]           Next level of care provider has access to [facility] Link:no  Safety Planning and Suicide Prevention discussed: No.; CSW attempted to contact pt's sister twice.     Has patient been referred to the Quitline?: Patient refused referral  Patient has been referred for addiction treatment: Yes  Delphia Grates, LCSW 01/02/2020, 9:39 AM

## 2020-01-02 NOTE — Progress Notes (Signed)
Recreation Therapy Notes  Date: 1.27.21 Time: 1000 Location: 500 Hall Dayroom  Group Topic: Self-Esteem  Goal Area(s) Addresses:  Patient will successfully identify positive attributes about themselves.  Patient will successfully identify benefit of improved self-esteem.   Behavioral Response: Engaged  Intervention: Worksheet, colored pencils, music  Activity: Crest of Arms.  Patients were to identify four things that are important to them and place them in the crest in a creative way.  Patients could identify important dates, accomplishments, people, etc.  Education:  Self-Esteem, Building control surveyor.   Education Outcome: Acknowledges education/In group clarification offered/Needs additional education  Clinical Observations/Feedback: Pt was engaged and appropriate in group.  Pt identified his son, his release date from prison (1.15.21), his birthday ([birth year]) and his future "wifey" as being important to him.  Pt was able to focus and remain on task.    Caroll Rancher, LRT/CTRS   Lillia Abed, Philomene Haff A 01/02/2020 11:13 AM
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
## 2020-01-02 NOTE — Progress Notes (Signed)
Recreation Therapy Notes  INPATIENT RECREATION TR PLAN  Patient Details Name: Alex Kline MRN: [id number] DOB: May 08, 1991 Today's Date: 01/02/2020  Rec Therapy Plan Is patient appropriate for Therapeutic Recreation?: Yes Treatment times per week: about 3 days Estimated Length of Stay: 5-7 days TR Treatment/Interventions: Group participation (Comment)  Discharge Criteria Pt will be discharged from therapy if:: Discharged Treatment plan/goals/alternatives discussed and agreed upon by:: Patient/family  Discharge Summary Short term goals set: See patient care plan Short term goals met: Complete Progress toward goals comments: Groups attended Which groups?: Coping skills, Self-esteem, Goal setting, Communication Reason goals not met: None Therapeutic equipment acquired: N/A Reason patient discharged from therapy: Discharge from hospital Pt/family agrees with progress & goals achieved: Yes Date patient discharged from therapy: 01/02/20    Victorino Sparrow, LRT/CTRS  Ria Comment, Lanell Carpenter A 01/02/2020, 11:38 AM

## 2020-01-02 NOTE — Tx Team (Signed)
Interdisciplinary Treatment and Diagnostic Plan Update  01/02/2020 Time of Session: 9:00am  Alex Kline MRN: [id number]  Principal Diagnosis: <principal problem not specified>  Secondary Diagnoses: Active Problems:   Schizophrenia (HCC)   Current Medications:  Current Facility-Administered Medications  Medication Dose Route Frequency Provider Last Rate Last Admin  . acetaminophen (TYLENOL) tablet 650 mg  650 mg Oral Q6H PRN Anike, Adaku C, NP   650 mg at 01/01/20 0803  . alum & mag hydroxide-simeth (MAALOX/MYLANTA) 200-200-20 MG/5ML suspension 30 mL  30 mL Oral Q4H PRN Anike, Adaku C, NP      . benztropine (COGENTIN) tablet 0.5 mg  0.5 mg Oral BID Malvin Johns, MD   0.5 mg at 01/02/20 0723  . divalproex (DEPAKOTE) DR tablet 250 mg  250 mg Oral Q8H Malvin Johns, MD   250 mg at 01/02/20 4098  . haloperidol (HALDOL) tablet 10 mg  10 mg Oral QHS Malvin Johns, MD   10 mg at 01/01/20 2103  . haloperidol (HALDOL) tablet 5 mg  5 mg Oral Q1200 Malvin Johns, MD      . haloperidol decanoate (HALDOL DECANOATE) 100 MG/ML injection 150 mg  150 mg Intramuscular Q30 days Malvin Johns, MD      . hydrOXYzine (ATARAX/VISTARIL) tablet 25 mg  25 mg Oral TID PRN Anike, Adaku C, NP   25 mg at 01/01/20 2103  . LORazepam (ATIVAN) tablet 2 mg  2 mg Oral Q4H PRN Malvin Johns, MD   2 mg at 12/30/19 1903   Or  . LORazepam (ATIVAN) injection 2 mg  2 mg Intramuscular Q4H PRN Malvin Johns, MD   2 mg at 12/28/19 1540  . magnesium hydroxide (MILK OF MAGNESIA) suspension 30 mL  30 mL Oral Daily PRN Anike, Adaku C, NP      . topiramate (TOPAMAX) tablet 50 mg  50 mg Oral BID Malvin Johns, MD   50 mg at 01/02/20 0723  . traZODone (DESYREL) tablet 100 mg  100 mg Oral QHS PRN Malvin Johns, MD   100 mg at 01/01/20 2103   PTA Medications: No medications prior to admission.    Patient Stressors:    Patient Strengths:    Treatment Modalities: Medication Management, Group therapy, Case management,  1 to 1 session with  clinician, Psychoeducation, Recreational therapy.   Physician Treatment Plan for Primary Diagnosis: <principal problem not specified> Long Term Goal(s): Improvement in symptoms so as ready for discharge Improvement in symptoms so as ready for discharge   Short Term Goals: Ability to demonstrate self-control will improve Ability to identify and develop effective coping behaviors will improve Ability to maintain clinical measurements within normal limits will improve Compliance with prescribed medications will improve  Medication Management: Evaluate patient's response, side effects, and tolerance of medication regimen.  Therapeutic Interventions: 1 to 1 sessions, Unit Group sessions and Medication administration.  Evaluation of Outcomes: Adequate for Discharge  Physician Treatment Plan for Secondary Diagnosis: Active Problems:   Schizophrenia (HCC)  Long Term Goal(s): Improvement in symptoms so as ready for discharge Improvement in symptoms so as ready for discharge   Short Term Goals: Ability to demonstrate self-control will improve Ability to identify and develop effective coping behaviors will improve Ability to maintain clinical measurements within normal limits will improve Compliance with prescribed medications will improve     Medication Management: Evaluate patient's response, side effects, and tolerance of medication regimen.  Therapeutic Interventions: 1 to 1 sessions, Unit Group sessions and Medication administration.  Evaluation of Outcomes: Adequate for  Discharge   RN Treatment Plan for Primary Diagnosis: <principal problem not specified> Long Term Goal(s): Knowledge of disease and therapeutic regimen to maintain health will improve  Short Term Goals: Ability to participate in decision making will improve, Ability to verbalize feelings will improve, Ability to disclose and discuss suicidal ideas, Ability to identify and develop effective coping behaviors will  improve and Compliance with prescribed medications will improve  Medication Management: RN will administer medications as ordered by provider, will assess and evaluate patient's response and provide education to patient for prescribed medication. RN will report any adverse and/or side effects to prescribing provider.  Therapeutic Interventions: 1 on 1 counseling sessions, Psychoeducation, Medication administration, Evaluate responses to treatment, Monitor vital signs and CBGs as ordered, Perform/monitor CIWA, COWS, AIMS and Fall Risk screenings as ordered, Perform wound care treatments as ordered.  Evaluation of Outcomes: Adequate for Discharge   LCSW Treatment Plan for Primary Diagnosis: <principal problem not specified> Long Term Goal(s): Safe transition to appropriate next level of care at discharge, Engage patient in therapeutic group addressing interpersonal concerns.  Short Term Goals: Engage patient in aftercare planning with referrals and resources and Increase skills for wellness and recovery  Therapeutic Interventions: Assess for all discharge needs, 1 to 1 time with Social worker, Explore available resources and support systems, Assess for adequacy in community support network, Educate family and significant other(s) on suicide prevention, Complete Psychosocial Assessment, Interpersonal group therapy.  Evaluation of Outcomes: Adequate for Discharge   Progress in Treatment: Attending groups: Yes. Participating in groups: Yes. Taking medication as prescribed: Yes. Toleration medication: Yes. Family/Significant other contact made: Yes, individual(s) contacted:  pt's mother  Patient understands diagnosis: Yes. Discussing patient identified problems/goals with staff: Yes. Medical problems stabilized or resolved: Yes. Denies suicidal/homicidal ideation: Yes. Issues/concerns per patient self-inventory: No. Other:   New problem(s) identified: No, Describe:  none  New Short  Term/Long Term Goal(s): Medication stabilization, elimination of SI thoughts, and development of a comprehensive mental wellness plan.   Patient Goals:  "go outside and get closer to my family"  Discharge Plan or Barriers: Pt is adequate for discharge  Reason for Continuation of Hospitalization: Delusions  Hallucinations  Estimated Length of Stay: Pt is adequate for discharge   Attendees: Patient: Alex Kline  01/02/2020   Physician: 01/02/2020   Nursing: Vladimir Faster, RN  01/02/2020   RN Care Manager: 01/02/2020   Social Worker: Ovidio Kin, MSW intern  01/02/2020   Recreational Therapist:  01/02/2020   Other: Marcie Bal, NP 01/02/2020   Other:  01/02/2020   Other: 01/02/2020     Scribe for Treatment Team: Billey Chang, [place] [facility] 01/02/2020 10:01 AM

## 2020-01-02 NOTE — [facility] Suicide Risk Assessment (Signed)
[facility] Discharge Suicide Risk Assessment   Principal Problem: Exacerbation of psychotic disorder Discharge Diagnoses: Active Problems:   Schizophrenia (HCC)   Total Time spent with patient: 45 minutes  Musculoskeletal: Strength & Muscle Tone: within normal limits Gait & Station: normal Patient leans: N/A  Psychiatric Specialty Exam: Review of Systems  Blood pressure 120/60, pulse 97, temperature 97.8 F (36.6 C), temperature source Oral, resp. rate 18, height 5\' 9"  (1.753 m), SpO2 100 %.There is no height or weight on file to calculate BMI.  General Appearance: Casual  Eye Contact::  Good  Speech:  Slow409  Volume:  Decreased  Mood:  Euthymic  Affect:  Restricted  Thought Process:  Coherent, Goal Directed and Descriptions of Associations: Circumstantial  Orientation:  Full (Time, Place, and Person)  Thought Content:  Tangential  Suicidal Thoughts:  No  Homicidal Thoughts:  No  Memory:  Immediate;   Fair Recent;   Fair Remote;   Fair  Judgement:  Fair  Insight:  Fair  Psychomotor Activity:  Normal  Concentration:  Fair  Recall:  [id number] of Knowledge:Fair  Language: Fair  Akathisia:  Negative  Handed:  Right  AIMS (if indicated):     Assets:  Communication Skills Desire for Improvement  Sleep:  Number of Hours: 6.75  Cognition: WNL  ADL's:  Intact   Mental Status Per Nursing Assessment::   On Admission:  Suicidal ideation indicated by others, Thoughts of violence towards others  Demographic Factors:  Male, Living alone and Unemployed  Loss Factors: Decrease in vocational status  Historical Factors: NA  Risk Reduction Factors:   Religious beliefs about death  Continued Clinical Symptoms:  Previous Psychiatric Diagnoses and Treatments  Cognitive Features That Contribute To Risk:  Loss of executive function    Suicide Risk:  Minimal: No identifiable suicidal ideation.  Patients presenting with no risk factors but with morbid ruminations; may be  classified as minimal risk based on the severity of the depressive symptoms  Follow-up Information    [facility] Follow up on 01/03/2020.   Why: You are scheduled for an appointment on 01/03/20 at 9:00 am.  This appointment will be held via telephone.  Please have your insurance information and your discharge summary from this hospitalization available. Contact information: [address] [place] [phone]           Plan Of Care/Follow-up recommendations:  Activity:  full  Jojo Pehl, MD 01/02/2020, 8:57 AM

## 2020-01-02 NOTE — Plan of Care (Signed)
Pt did not display any impulsive behaviors during recreational therapy group sessions.   Caroll Rancher, LRT/CTRS

## 2020-02-23 ENCOUNTER — Encounter (HOSPITAL_COMMUNITY): Payer: Self-pay | Admitting: Family Medicine

## 2020-02-23 ENCOUNTER — Other Ambulatory Visit: Payer: Self-pay

## 2020-02-23 ENCOUNTER — Ambulatory Visit (HOSPITAL_COMMUNITY)
Admission: EM | Admit: 2020-02-23 | Discharge: 2020-02-23 | Disposition: A | Discharge status: Home / Self Care | Payer: Self-pay | Attending: Family Medicine | Admitting: Family Medicine

## 2020-02-23 DIAGNOSIS — R112 Nausea with vomiting, unspecified: Secondary | ICD-10-CM

## 2020-02-23 DIAGNOSIS — R05 Cough: Secondary | ICD-10-CM

## 2020-02-23 DIAGNOSIS — R059 Cough, unspecified: Secondary | ICD-10-CM

## 2020-02-23 MED ORDER — BENZTROPINE MESYLATE 1 MG PO TABS: 1.0000 mg | ORAL_TABLET | Freq: Two times a day (BID) | ORAL | 2 refills | Status: DC

## 2020-02-23 MED ORDER — DIVALPROEX SODIUM 250 MG PO DR TAB: 250.0000 mg | DELAYED_RELEASE_TABLET | Freq: Three times a day (TID) | ORAL | 2 refills | Status: DC

## 2020-02-23 MED ORDER — HALOPERIDOL 5 MG PO TABS: 15.0000 mg | ORAL_TABLET | Freq: Every day | ORAL | 2 refills | Status: DC

## 2020-02-23 MED ORDER — HALOPERIDOL DECANOATE 100 MG/ML IM SOLN: 150.0000 mg | INTRAMUSCULAR | 11 refills | Status: DC

## 2020-02-23 MED ORDER — SUCRALFATE 1 GM/10ML PO SUSP: 1.0000 g | Freq: Three times a day (TID) | ORAL | 0 refills | Status: DC

## 2020-02-23 NOTE — ED Provider Notes (Addendum)
[place]    CSN: [id number] Arrival date & time: 02/23/20  1135      History   Chief Complaint Chief Complaint  Patient presents with  . Emesis  . Cough    HPI Alex Kline is a 29 y.o. male.   Initial [facility] visit in over 5 years.  Patient presents with vomiting and cough.  Pt here c/o intermittent vomiting x 3 weeks with cough; pt sts last vomited 2 days ago.  He notes tightness around his "adam's apple" but no chest pain or abdominal pain.  Denies fever.  Normally gets medication through [facility].  Wants different pharmacy.  Works at [employer] and needs work note from missing yesterday     Past Medical History:  Diagnosis Date  . Bipolar disorder ([place])   . Schizophrenia [facility])     Patient Active Problem List   Diagnosis Date Noted  . Schizophrenia ([place]) 12/26/2019    History reviewed. No pertinent surgical history.     Home Medications    Prior to Admission medications   Medication Sig Start Date End Date Taking? Authorizing Provider  benztropine (COGENTIN) 1 MG tablet Take 1 tablet (1 mg total) by mouth 2 (two) times daily. 02/23/20   Robyn Haber, MD  divalproex (DEPAKOTE) 250 MG DR tablet Take 1 tablet (250 mg total) by mouth 3 (three) times daily. 02/23/20   Robyn Haber, MD  haloperidol (HALDOL) 5 MG tablet Take 3 tablets (15 mg total) by mouth at bedtime. 02/23/20   Robyn Haber, MD  haloperidol decanoate (HALDOL DECANOATE) 100 MG/ML injection Inject 1.5 mLs (150 mg total) into the muscle every 30 (thirty) days. Due 2/26 02/23/20   Robyn Haber, MD  sucralfate (CARAFATE) 1 GM/10ML suspension Take 10 mLs (1 g total) by mouth 4 (four) times daily -  with meals and at bedtime. 02/23/20   Robyn Haber, MD    Family History History reviewed. No pertinent family history.  Social History Social History   Tobacco Use  . Smoking status: Current Every Day Smoker    Types: Cigarettes  . Smokeless tobacco: Never Used    Substance Use Topics  . Alcohol use: Yes    Comment: occ  . Drug use: Yes    Types: Marijuana     Allergies   Patient has no known allergies.   Review of Systems Review of Systems   Physical Exam Triage Vital Signs ED Triage Vitals  Enc Vitals Group     BP      Pulse      Resp      Temp      Temp src      SpO2      Weight      Height      Head Circumference      Peak Flow      Pain Score      Pain Loc      Pain Edu?      Excl. in [place]?    No data found.  Updated Vital Signs BP 130/81 (BP Location: Left Arm)   Pulse 93   Temp 98.2 F (36.8 C) (Oral)   Resp 18   SpO2 97%    Physical Exam Vitals reviewed.  Constitutional:      General: He is not in acute distress.    Appearance: Normal appearance. He is normal weight. He is not ill-appearing.  HENT:     Head: Normocephalic.     Mouth/Throat:  Mouth: Mucous membranes are moist.  Eyes:     Conjunctiva/sclera: Conjunctivae normal.  Cardiovascular:     Rate and Rhythm: Normal rate and regular rhythm.     Pulses: Normal pulses.     Heart sounds: Normal heart sounds.  Pulmonary:     Effort: Pulmonary effort is normal.     Breath sounds: Normal breath sounds.  Abdominal:     General: There is no distension.     Palpations: Abdomen is soft. There is no mass.     Tenderness: There is no abdominal tenderness.  Musculoskeletal:        General: Normal range of motion.     Cervical back: Normal range of motion and neck supple.  Skin:    General: Skin is warm and dry.  Neurological:     Mental Status: He is alert. Mental status is at baseline.     Comments: Bilateral 5/sec tremor  Psychiatric:     Comments: Flat expression      UC Treatments / Results  Labs (all labs ordered are listed, but only abnormal results are displayed) Labs Reviewed - No data to display  EKG   Radiology No results found.  Procedures Procedures (including critical care time)  Medications Ordered in  UC Medications - No data to display  Initial Impression / Assessment and Plan / UC Course  I have reviewed the triage vital signs and the nursing notes.  Pertinent labs & imaging results that were available during my care of the patient were reviewed by me and considered in my medical decision making (see chart for details).    Final Clinical Impressions(s) / UC Diagnoses   Final diagnoses:  Non-intractable vomiting with nausea, unspecified vomiting type  Cough   Discharge Instructions   None    ED Prescriptions    Medication Sig Dispense Auth. Provider   benztropine (COGENTIN) 1 MG tablet  (Status: Discontinued) Take 1 tablet (1 mg total) by mouth 2 (two) times daily. 60 tablet Elvina Sidle, MD   divalproex (DEPAKOTE) 250 MG DR tablet  (Status: Discontinued) Take 1 tablet (250 mg total) by mouth 3 (three) times daily. 90 tablet Elvina Sidle, MD   haloperidol (HALDOL) 5 MG tablet  (Status: Discontinued) Take 3 tablets (15 mg total) by mouth at bedtime. 90 tablet Elvina Sidle, MD   haloperidol decanoate (HALDOL DECANOATE) 100 MG/ML injection  (Status: Discontinued) Inject 1.5 mLs (150 mg total) into the muscle every 30 (thirty) days. Due 2/26 1 mL Elvina Sidle, MD   benztropine (COGENTIN) 1 MG tablet Take 1 tablet (1 mg total) by mouth 2 (two) times daily. 60 tablet Elvina Sidle, MD   divalproex (DEPAKOTE) 250 MG DR tablet Take 1 tablet (250 mg total) by mouth 3 (three) times daily. 90 tablet Elvina Sidle, MD   haloperidol (HALDOL) 5 MG tablet Take 3 tablets (15 mg total) by mouth at bedtime. 90 tablet Elvina Sidle, MD   haloperidol decanoate (HALDOL DECANOATE) 100 MG/ML injection Inject 1.5 mLs (150 mg total) into the muscle every 30 (thirty) days. Due 2/26 1 mL Elvina Sidle, MD   sucralfate (CARAFATE) 1 GM/10ML suspension Take 10 mLs (1 g total) by mouth 4 (four) times daily -  with meals and at bedtime. 420 mL Elvina Sidle, MD     I have reviewed  the PDMP during this encounter.   Elvina Sidle, MD 02/23/20 1203    Elvina Sidle, MD 02/23/20 [phone]

## 2020-02-23 NOTE — ED Triage Notes (Signed)
Pt here c/o intermittent vomiting x 3 weeks with cough; pt sts last vomited 2 days ago

## 2020-03-16 ENCOUNTER — Encounter (HOSPITAL_COMMUNITY): Payer: Self-pay | Admitting: Psychiatry

## 2020-03-16 ENCOUNTER — Other Ambulatory Visit: Payer: Self-pay

## 2020-03-16 ENCOUNTER — Inpatient Hospital Stay (HOSPITAL_COMMUNITY)
Admission: RE | Admit: 2020-03-16 | Discharge: 2020-03-19 | DRG: 885 | Disposition: A | Discharge status: Home / Self Care | Payer: Federal, State, Local not specified - Other | Attending: Psychiatry | Admitting: Psychiatry

## 2020-03-16 DIAGNOSIS — F1210 Cannabis abuse, uncomplicated: Secondary | ICD-10-CM | POA: Diagnosis present

## 2020-03-16 DIAGNOSIS — F322 Major depressive disorder, single episode, severe without psychotic features: Secondary | ICD-10-CM | POA: Diagnosis not present

## 2020-03-16 DIAGNOSIS — F209 Schizophrenia, unspecified: Principal | ICD-10-CM | POA: Diagnosis present

## 2020-03-16 DIAGNOSIS — F200 Paranoid schizophrenia: Secondary | ICD-10-CM

## 2020-03-16 DIAGNOSIS — F419 Anxiety disorder, unspecified: Secondary | ICD-10-CM | POA: Diagnosis present

## 2020-03-16 DIAGNOSIS — Z20822 Contact with and (suspected) exposure to covid-19: Secondary | ICD-10-CM | POA: Diagnosis present

## 2020-03-16 DIAGNOSIS — R45851 Suicidal ideations: Secondary | ICD-10-CM

## 2020-03-16 DIAGNOSIS — G4700 Insomnia, unspecified: Secondary | ICD-10-CM | POA: Diagnosis present

## 2020-03-16 DIAGNOSIS — F17210 Nicotine dependence, cigarettes, uncomplicated: Secondary | ICD-10-CM | POA: Diagnosis present

## 2020-03-16 LAB — COMPREHENSIVE METABOLIC PANEL
ALT: 11 U/L (ref 0–44)
AST: 13 U/L — ABNORMAL LOW (ref 15–41)
Albumin: 4.3 g/dL (ref 3.5–5.0)
Alkaline Phosphatase: 69 U/L (ref 38–126)
Anion gap: 9 (ref 5–15)
BUN: 11 mg/dL (ref 6–20)
CO2: 26 mmol/L (ref 22–32)
Calcium: 9 mg/dL (ref 8.9–10.3)
Chloride: 104 mmol/L (ref 98–111)
Creatinine, Ser: 1.14 mg/dL (ref 0.61–1.24)
GFR calc Af Amer: >60 mL/min (ref >60)
GFR calc non Af Amer: >60 mL/min (ref >60)
Glucose, Bld: 91 mg/dL (ref 70–99)
Potassium: 3.5 mmol/L (ref 3.5–5.1)
Sodium: 139 mmol/L (ref 135–145)
Total Bilirubin: 0.7 mg/dL (ref 0.3–1.2)
Total Protein: 7 g/dL (ref 6.5–8.1)

## 2020-03-16 LAB — CBC WITH DIFFERENTIAL/PLATELET
Abs Immature Granulocytes: 0.02 10*3/uL (ref 0.00–0.07)
Basophils Absolute: 0 10*3/uL (ref 0.0–0.1)
Basophils Relative: 0 %
Eosinophils Absolute: 0.1 10*3/uL (ref 0.0–0.5)
Eosinophils Relative: 2 %
HCT: 44.7 % (ref 39.0–52.0)
Hemoglobin: 13.9 g/dL (ref 13.0–17.0)
Immature Granulocytes: 0 %
Lymphocytes Relative: 51 %
Lymphs Abs: 2.6 10*3/uL (ref 0.7–4.0)
MCH: 26.8 pg (ref 26.0–34.0)
MCHC: 31.1 g/dL (ref 30.0–36.0)
MCV: 86.1 fL (ref 80.0–100.0)
Monocytes Absolute: 0.5 10*3/uL (ref 0.1–1.0)
Monocytes Relative: 9 %
Neutro Abs: 1.9 10*3/uL (ref 1.7–7.7)
Neutrophils Relative %: 38 %
Platelets: 202 10*3/uL (ref 150–400)
RBC: 5.19 MIL/uL (ref 4.22–5.81)
RDW: 14.5 % (ref 11.5–15.5)
WBC: 5.1 10*3/uL (ref 4.0–10.5)
nRBC: 0 % (ref 0.0–0.2)

## 2020-03-16 LAB — RESPIRATORY PANEL BY RT PCR (FLU A&B, COVID)
Influenza A by PCR: NEGATIVE
Influenza B by PCR: NEGATIVE
SARS Coronavirus 2 by RT PCR: NEGATIVE

## 2020-03-16 LAB — VALPROIC ACID LEVEL: Valproic Acid Lvl: 41 ug/mL — ABNORMAL LOW (ref 50.0–100.0)

## 2020-03-16 MED ORDER — DIVALPROEX SODIUM 250 MG PO DR TAB
250.0000 mg | DELAYED_RELEASE_TABLET | Freq: Three times a day (TID) | ORAL | Status: DC
  Administered 2020-03-16 – 2020-03-19 (×11): 250 mg via ORAL
  Filled 2020-03-16 (×3): qty 1
  Filled 2020-03-16: qty 30
  Filled 2020-03-16 (×8): qty 1
  Filled 2020-03-16: qty 30
  Filled 2020-03-16 (×3): qty 1
  Filled 2020-03-16: qty 30

## 2020-03-16 MED ORDER — OLANZAPINE 10 MG PO TBDP: 10.0000 mg | ORAL_TABLET | Freq: Three times a day (TID) | ORAL | Status: DC | PRN

## 2020-03-16 MED ORDER — THIAMINE HCL 100 MG PO TABS
100.0000 mg | ORAL_TABLET | Freq: Every day | ORAL | Status: DC
  Administered 2020-03-16 – 2020-03-19 (×4): 100 mg via ORAL
  Filled 2020-03-16 (×7): qty 1

## 2020-03-16 MED ORDER — FOLIC ACID 1 MG PO TABS
1.0000 mg | ORAL_TABLET | Freq: Every day | ORAL | Status: DC
  Administered 2020-03-16 – 2020-03-19 (×4): 1 mg via ORAL
  Filled 2020-03-16 (×7): qty 1

## 2020-03-16 MED ORDER — TRAZODONE HCL 50 MG PO TABS
50.0000 mg | ORAL_TABLET | Freq: Every evening | ORAL | Status: DC | PRN
  Administered 2020-03-16 – 2020-03-18 (×3): 50 mg via ORAL
  Filled 2020-03-16 (×3): qty 1

## 2020-03-16 MED ORDER — ZIPRASIDONE MESYLATE 20 MG IM SOLR: 20.0000 mg | INTRAMUSCULAR | Status: DC | PRN

## 2020-03-16 MED ORDER — ACETAMINOPHEN 325 MG PO TABS: 650.0000 mg | ORAL_TABLET | Freq: Four times a day (QID) | ORAL | Status: DC | PRN

## 2020-03-16 MED ORDER — OMEGA-3-ACID ETHYL ESTERS 1 G PO CAPS
1.0000 g | ORAL_CAPSULE | Freq: Two times a day (BID) | ORAL | Status: DC
  Administered 2020-03-16 – 2020-03-19 (×6): 1 g via ORAL
  Filled 2020-03-16 (×2): qty 1
  Filled 2020-03-16 (×2): qty 14
  Filled 2020-03-16 (×6): qty 1

## 2020-03-16 MED ORDER — LORAZEPAM 1 MG PO TABS: 1.0000 mg | ORAL_TABLET | ORAL | Status: DC | PRN

## 2020-03-16 MED ORDER — ALUM & MAG HYDROXIDE-SIMETH 200-200-20 MG/5ML PO SUSP: 30.0000 mL | ORAL | Status: DC | PRN

## 2020-03-16 MED ORDER — BENZTROPINE MESYLATE 1 MG PO TABS
1.0000 mg | ORAL_TABLET | Freq: Two times a day (BID) | ORAL | Status: DC
  Administered 2020-03-16: 1 mg via ORAL
  Filled 2020-03-16 (×3): qty 1

## 2020-03-16 MED ORDER — BENZTROPINE MESYLATE 1 MG PO TABS
1.0000 mg | ORAL_TABLET | Freq: Three times a day (TID) | ORAL | Status: DC
  Administered 2020-03-16 – 2020-03-19 (×10): 1 mg via ORAL
  Filled 2020-03-16 (×18): qty 1

## 2020-03-16 MED ORDER — LORAZEPAM 1 MG PO TABS: 1.0000 mg | ORAL_TABLET | Freq: Four times a day (QID) | ORAL | Status: DC | PRN

## 2020-03-16 MED ORDER — MAGNESIUM HYDROXIDE 400 MG/5ML PO SUSP: 30.0000 mL | Freq: Every day | ORAL | Status: DC | PRN

## 2020-03-16 NOTE — [facility]HH Suicide Risk Assessment (Signed)
[facility] Admission Suicide Risk Assessment   Nursing information obtained from:  Patient Demographic factors:  Age 29 or older, Unemployed Current Mental Status:  Suicidal ideation indicated by patient Loss Factors:  Decrease in vocational status, Legal issues Historical Factors:  Impulsivity Risk Reduction Factors:  Positive therapeutic relationship  Total Time spent with patient: 30 minutes Principal Problem: <principal problem not specified> Diagnosis:  Active Problems:   Schizophrenia (HCC)   Suicidal ideation   MDD (major depressive disorder), severe (HCC)  Subjective Data: Patient is seen and examined.  Patient is a 29 year old male with a past psychiatric history significant for schizophrenia who presented as a walk-in patient to the behavioral health hospital on 03/16/2020 with suicidal ideation.  The patient stated that he had been getting into disagreements with his mother and father, and "they put me out".  He also stated that he was living with a friend and that was where he would return to.  He stated that the issues with his parents made him suicidal, and a plan to slit his wrist or hang himself.  He denied any previous self-harm in the past.  His first psychiatric admission at our facility was in January 2021.  At that time he had auditory and visual hallucinations, had been sexually inappropriate, and required hospitalization.  During that hospitalization he was started on Risperdal, but had been stopped secondary to hypotension.  Zyprexa was initiated, but he remains psychotic.  He was placed on haloperidol, Cogentin and Depakote.  He improved with these medications.  He stated that he received his last Haldol Decanoate injection of 150 mg on 03/06/2020.  He stated he had been compliant with his Depakote.  He denied any auditory or visual hallucinations.  He does have a tremor, but on physical examination did not have any cogwheeling.  He is currently on probation and wearing an ankle  bracelet.  He is apparently on probation secondary to some weapon charge.  He is apparently to be released on probation on June 2021.  He admitted to the use of approximately 1 g of marijuana a day, and stated that he had been drinking alcohol at least 2 to 3 days a week.  He denied any alcohol withdrawal symptoms in the past.  No laboratories are currently available.  He was admitted to the hospital for evaluation and stabilization.  Continued Clinical Symptoms:  Alcohol Use Disorder Identification Test Final Score (AUDIT): 2 The "Alcohol Use Disorders Identification Test", Guidelines for Use in Primary Care, Second Edition.  World Science writer [facility]). Score between 0-7:  no or low risk or alcohol related problems. Score between 8-15:  moderate risk of alcohol related problems. Score between 16-19:  high risk of alcohol related problems. Score 20 or above:  warrants further diagnostic evaluation for alcohol dependence and treatment.   CLINICAL FACTORS:   Alcohol/Substance Abuse/Dependencies Schizophrenia:   Less than 53 years old Paranoid or undifferentiated type   Musculoskeletal: Strength & Muscle Tone: within normal limits Gait & Station: normal Patient leans: N/A  Psychiatric Specialty Exam: Physical Exam  Nursing note and vitals reviewed. Constitutional: He is oriented to person, place, and time. He appears well-developed and well-nourished.  HENT:  Head: Normocephalic and atraumatic.  Respiratory: Effort normal.  Neurological: He is alert and oriented to person, place, and time.    Review of Systems  Blood pressure 119/68, pulse 83, temperature 98.1 F (36.7 C), temperature source Oral, resp. rate 18, height 5\' 9"  (1.753 m), weight 72.6 kg, SpO2 100 %.  Body mass index is 23.63 kg/m.  General Appearance: Casual  Eye Contact:  Fair  Speech:  Normal Rate  Volume:  Decreased  Mood:  Anxious and Dysphoric  Affect:  Flat  Thought Process:  Goal Directed and Descriptions  of Associations: Circumstantial  Orientation:  Full (Time, Place, and Person)  Thought Content:  Delusions and Hallucinations: Auditory  Suicidal Thoughts:  Yes.  without intent/plan  Homicidal Thoughts:  No  Memory:  Immediate;   Fair Recent;   Fair Remote;   Fair  Judgement:  Impaired  Insight:  Fair  Psychomotor Activity:  Increased  Concentration:  Concentration: Fair and Attention Span: Fair  Recall:  [employer] of Knowledge:  Fair  Language:  Fair  Akathisia:  Negative  Handed:  Right  AIMS (if indicated):     Assets:  Desire for Improvement Resilience  ADL's:  Intact  Cognition:  WNL  Sleep:         COGNITIVE FEATURES THAT CONTRIBUTE TO RISK:  None    SUICIDE RISK:   Mild:  Suicidal ideation of limited frequency, intensity, duration, and specificity.  There are no identifiable plans, no associated intent, mild dysphoria and related symptoms, good self-control (both objective and subjective assessment), few other risk factors, and identifiable protective factors, including available and accessible social support.  PLAN OF CARE: Patient is seen and examined.  Patient is a 29 year old male with the above-stated past psychiatric history who was admitted secondary to suicidal ideation.  He will be admitted to the unit.  He will be encouraged to attend groups.  He will be encouraged to work on his coping skills.  We will have to confirm his most recent Haldol Decanoate injection.  He denied auditory or visual hallucinations currently, but we still need to confirm the accuracy of that dosage and whether they are giving that to him every 2 weeks or every month.  We will also change his laboratories to be drawn this afternoon.  We will go on a draws Depakote level at that time as well.  That she believes it was some evidence of compliance with his Depakote.  He needs to stop using marijuana and alcohol, and I will place lorazepam 1 mg p.o. every 6 hours as needed a CIWA greater than 10  just in case that his tremor and other symptoms are secondary to alcohol withdrawal.  He does have a tremor, but negative on physical examination for cogwheeling.  I will increase his Cogentin to 1 mg p.o. 3 times daily, but I suspect that the tremor may not be necessarily related to the Haldol.  We will also contact his family for collateral information to find out exactly what was happening at home and how much of the disagreement was secondary to paranoia or issues that have underlying linked to his illness.  His vital signs are stable, he is afebrile at this point.  We will also get information with regard to his legal issues and whether or not he has any court dates coming up.  I certify that inpatient services furnished can reasonably be expected to improve the patient's condition.   Sharma Covert, MD 03/16/2020, 11:15 AM

## 2020-03-16 NOTE — H&P (Signed)
Psychiatric Admission Assessment Adult  Patient Identification: Alex Kline MRN:  [id number] Date of Evaluation:  03/16/2020 Chief Complaint:  Schizophrenia ([place]) [F20.9] Suicidal ideation [R45.851] Principal Diagnosis: <principal problem not specified> Diagnosis:  Active Problems:   Schizophrenia ([facility])   Suicidal ideation   MDD (major depressive disorder), severe ([place])  History of Present Illness: Patient is seen and examined.  Patient is a 29 year old male with a past psychiatric history significant for schizophrenia who presented as a walk-in patient to the behavioral health hospital on 03/16/2020 with suicidal ideation.  The patient stated that he had been getting into disagreements with his mother and father, and "they put me out".  He also stated that he was living with a friend and that was where he would return to.  He stated that the issues with his parents made him suicidal, and a plan to slit his wrist or hang himself.  He denied any previous self-harm in the past.  His first psychiatric admission at our facility was in January 2021.  At that time he had auditory and visual hallucinations, had been sexually inappropriate, and required hospitalization.  During that hospitalization he was started on Risperdal, but had been stopped secondary to hypotension.  Zyprexa was initiated, but he remains psychotic.  He was placed on haloperidol, Cogentin and Depakote.  He improved with these medications.  He stated that he received his last Haldol Decanoate injection of 150 mg on 03/06/2020.  He stated he had been compliant with his Depakote.  He denied any auditory or visual hallucinations.  He does have a tremor, but on physical examination did not have any cogwheeling.  He is currently on probation and wearing an ankle bracelet.  He is apparently on probation secondary to some weapon charge.  He is apparently to be released on probation on June 2021.  He admitted to the use of approximately 1 g of  marijuana a day, and stated that he had been drinking alcohol at least 2 to 3 days a week.  He denied any alcohol withdrawal symptoms in the past.  No laboratories are currently available.  He was admitted to the hospital for evaluation and stabilization.  Associated Signs/Symptoms: Depression Symptoms:  anhedonia, insomnia, fatigue, hopelessness, suicidal thoughts without plan, anxiety, disturbed sleep, (Hypo) Manic Symptoms:  Delusions, Hallucinations, Impulsivity, Irritable Mood, Labiality of Mood, Anxiety Symptoms:  Excessive Worry, Psychotic Symptoms:  Delusions, Hallucinations: Auditory Paranoia, PTSD Symptoms: Negative Total Time spent with patient: 30 minutes  Past Psychiatric History: This is the patient's second psychiatric hospitalization as well as a second admission to our facility during 2021.  His first admission was in January of this year.  He has been previously diagnosed with schizophrenia.  He has been previously treated with Risperdal, Zyprexa, Haldol, Cogentin and Depakote.  Reportedly he had side effects or ineffective response to Risperdal and Zyprexa and he is on Haldol Decanoate 150 mg.  He has a history of daily cannabis use over the last several years.  And apparently he also had an overdose of Celexa and Topamax in 2013 in an attempt to become intoxicated and was not a suicide attempt.  Is the patient at risk to self? Yes.    Has the patient been a risk to self in the past 6 months? Yes.    Has the patient been a risk to self within the distant past? Yes.    Is the patient a risk to others? No.  Has the patient been a risk to others in  the past 6 months? No.  Has the patient been a risk to others within the distant past? No.   Prior Inpatient Therapy: Prior Inpatient Therapy: Yes Prior Therapy Dates: 12/2019, multiple admits Prior Therapy Facilty/Provider(s): [facility] Reason for Treatment: Schizophrenia Prior Outpatient Therapy: Prior Outpatient  Therapy: Yes Prior Therapy Dates: Current  Prior Therapy Facilty/Provider(s): [facility] Reason for Treatment: Schizophrenia Does patient have an ACCT team?: No Does patient have Intensive In-House Services?  : No Does patient have [facility] services? : Yes Does patient have P4CC services?: No  Alcohol Screening: 1. How often do you have a drink containing alcohol?: 2 to 4 times a month 2. How many drinks containing alcohol do you have on a typical day when you are drinking?: 1 or 2 3. How often do you have six or more drinks on one occasion?: Never AUDIT-C Score: 2 9. Have you or someone else been injured as a result of your drinking?: No 10. Has a relative or friend or a doctor or another health worker been concerned about your drinking or suggested you cut down?: No Alcohol Use Disorder Identification Test Final Score (AUDIT): 2 Alcohol Brief Interventions/Follow-up: AUDIT Score <7 follow-up not indicated, Alcohol Education Substance Abuse History in the last 12 months:  Yes.   Consequences of Substance Abuse: Medical Consequences:  Clearly has been associated with his last 2 admissions. Family Consequences:  I am concerned that his substance use is what is exacerbating his family issues leading to him having to leave the home. Previous Psychotropic Medications: Yes  Psychological Evaluations: Yes  Past Medical History:  Past Medical History:  Diagnosis Date  . Bipolar disorder (HCC)   . Schizophrenia ([facility])    History reviewed. No pertinent surgical history. Family History: History reviewed. No pertinent family history. Family Psychiatric  History: Denied Tobacco Screening:   Social History:  Social History   Substance and Sexual Activity  Alcohol Use Yes   Comment: occ     Social History   Substance and Sexual Activity  Drug Use Yes  . Types: Marijuana    Additional Social History: Marital status: Single    Pain Medications: Denies abuse Prescriptions: Denies  abuse Over the Counter: Denies abuse History of alcohol / drug use?: Yes Longest period of sobriety (when/how long): Unknown Name of Substance 1: Marijuana 1 - Age of First Use: Adolescent 1 - Amount (size/oz): 1 gram 1 - Frequency: Pt reports returning to use 3 days ago 1 - Duration: Ongoing 1 - Last Use / Amount: 03/13/2020 Name of Substance 2: Alcohol 2 - Age of First Use: Adolescent 2 - Amount (size/oz): 1 case of beer 2 - Frequency: Approximately 2x per week 2 - Duration: Ongoing 2 - Last Use / Amount: 03/11/2020                Allergies:  No Known Allergies Lab Results:  Results for orders placed or performed during the hospital encounter of 03/16/20 (from the past 48 hour(s))  Respiratory Panel by RT PCR (Flu A&B, Covid) - Nasopharyngeal Swab     Status: None   Collection Time: 03/16/20  6:39 AM   Specimen: Nasopharyngeal Swab  Result Value Ref Range   SARS Coronavirus 2 by RT PCR NEGATIVE NEGATIVE    Comment: (NOTE) SARS-CoV-2 target nucleic acids are NOT DETECTED. The SARS-CoV-2 RNA is generally detectable in upper respiratoy specimens during the acute phase of infection. The lowest concentration of SARS-CoV-2 viral copies this assay can detect is 131 copies/mL.  A negative result does not preclude SARS-Cov-2 infection and should not be used as the sole basis for treatment or other patient management decisions. A negative result may occur with  improper specimen collection/handling, submission of specimen other than nasopharyngeal swab, presence of viral mutation(s) within the areas targeted by this assay, and inadequate number of viral copies (<131 copies/mL). A negative result must be combined with clinical observations, patient history, and epidemiological information. The expected result is Negative. Fact Sheet for Patients:  https://www.moore.com/ Fact Sheet for Healthcare Providers:  https://www.young.biz/ This test  is not yet ap proved or cleared by the [place] FDA and  has been authorized for detection and/or diagnosis of SARS-CoV-2 by FDA under an Emergency Use Authorization (EUA). This EUA will remain  in effect (meaning this test can be used) for the duration of the COVID-19 declaration under Section 564(b)(1) of the Act, 21 U.S.C. section 360bbb-3(b)(1), unless the authorization is terminated or revoked sooner.    Influenza A by PCR NEGATIVE NEGATIVE   Influenza B by PCR NEGATIVE NEGATIVE    Comment: (NOTE) The Xpert Xpress SARS-CoV-2/FLU/RSV assay is intended as an aid in  the diagnosis of influenza from Nasopharyngeal swab specimens and  should not be used as a sole basis for treatment. Nasal washings and  aspirates are unacceptable for Xpert Xpress SARS-CoV-2/FLU/RSV  testing. Fact Sheet for Patients: https://www.moore.com/ Fact Sheet for Healthcare Providers: https://www.young.biz/ This test is not yet approved or cleared by the [place] FDA and  has been authorized for detection and/or diagnosis of SARS-CoV-2 by  FDA under an Emergency Use Authorization (EUA). This EUA will remain  in effect (meaning this test can be used) for the duration of the  Covid-19 declaration under Section 564(b)(1) of the Act, 21  U.S.C. section 360bbb-3(b)(1), unless the authorization is  terminated or revoked. Performed at [facility], [address], [place], [place] [address]     Blood Alcohol level:  Lab Results  Component Value Date   [facility] <10 12/26/2019   ETH <5 07/13/2015    Metabolic Disorder Labs:  Lab Results  Component Value Date   HGBA1C 5.5 12/26/2019   MPG 111.15 12/26/2019   Lab Results  Component Value Date   PROLACTIN 24.8 (H) 12/26/2019   Lab Results  Component Value Date   CHOL 134 12/26/2019   TRIG 46 12/26/2019   HDL 43 12/26/2019   CHOLHDL 3.1 12/26/2019   VLDL 9 12/26/2019   LDLCALC 82  12/26/2019    Current Medications: Current Facility-Administered Medications  Medication Dose Route Frequency Provider Last Rate Last Admin  . acetaminophen (TYLENOL) tablet 650 mg  650 mg Oral Q6H PRN Gillermo Murdoch, NP      . alum & mag hydroxide-simeth (MAALOX/MYLANTA) 200-200-20 MG/5ML suspension 30 mL  30 mL Oral Q4H PRN Gillermo Murdoch, NP      . benztropine (COGENTIN) tablet 1 mg  1 mg Oral TID Antonieta Pert, MD      . divalproex (DEPAKOTE) DR tablet 250 mg  250 mg Oral TID Gillermo Murdoch, NP   250 mg at 03/16/20 0815  . folic acid (FOLVITE) tablet 1 mg  1 mg Oral Daily Antonieta Pert, MD      . LORazepam (ATIVAN) tablet 1 mg  1 mg Oral Q6H PRN Antonieta Pert, MD      . OLANZapine zydis (ZYPREXA) disintegrating tablet 10 mg  10 mg Oral Q8H PRN Antonieta Pert, MD  And  . LORazepam (ATIVAN) tablet 1 mg  1 mg Oral PRN Antonieta Pertlary, Kharlie Bring Lawson, MD       And  . ziprasidone (GEODON) injection 20 mg  20 mg Intramuscular PRN Antonieta Pertlary, Baylee Mccorkel Lawson, MD      . magnesium hydroxide (MILK OF MAGNESIA) suspension 30 mL  30 mL Oral Daily PRN Gillermo Murdochhompson, Jacqueline, NP      . omega-3 acid ethyl esters (LOVAZA) capsule 1 g  1 g Oral BID Antonieta Pertlary, Loribeth Katich Lawson, MD      . thiamine tablet 100 mg  100 mg Oral Daily Antonieta Pertlary, Brieann Osinski Lawson, MD      . traZODone (DESYREL) tablet 50 mg  50 mg Oral QHS PRN Antonieta Pertlary, Brahm Barbeau Lawson, MD       PTA Medications: Medications Prior to Admission  Medication Sig Dispense Refill Last Dose  . benztropine (COGENTIN) 1 MG tablet Take 1 tablet (1 mg total) by mouth 2 (two) times daily. 60 tablet 2   . divalproex (DEPAKOTE) 250 MG DR tablet Take 1 tablet (250 mg total) by mouth 3 (three) times daily. 90 tablet 2   . haloperidol (HALDOL) 5 MG tablet Take 3 tablets (15 mg total) by mouth at bedtime. 90 tablet 2   . haloperidol decanoate (HALDOL DECANOATE) 100 MG/ML injection Inject 1.5 mLs (150 mg total) into the muscle every 30 (thirty) days. Due 2/26 1 mL 11  03/06/2020    Musculoskeletal: Strength & Muscle Tone: within normal limits Gait & Station: normal Patient leans: N/A  Psychiatric Specialty Exam: Physical Exam  Nursing note and vitals reviewed. Constitutional: He is oriented to person, place, and time. He appears well-developed and well-nourished.  HENT:  Head: Normocephalic and atraumatic.  Respiratory: Effort normal.  Neurological: He is alert and oriented to person, place, and time.    Review of Systems  Blood pressure 119/68, pulse 83, temperature 98.1 F (36.7 C), temperature source Oral, resp. rate 18, height 5\' 9"  (1.753 m), weight 72.6 kg, SpO2 100 %.Body mass index is 23.63 kg/m.  General Appearance: Casual  Eye Contact:  Good  Speech:  Normal Rate  Volume:  Decreased  Mood:  Anxious and Dysphoric  Affect:  Flat  Thought Process:  Goal Directed and Descriptions of Associations: Circumstantial  Orientation:  Full (Time, Place, and Person)  Thought Content:  Delusions and Hallucinations: Auditory  Suicidal Thoughts:  Yes.  without intent/plan  Homicidal Thoughts:  No  Memory:  Immediate;   Fair Recent;   Fair Remote;   Fair  Judgement:  Impaired  Insight:  Fair  Psychomotor Activity:  Increased  Concentration:  Concentration: Fair and Attention Span: Fair  Recall:  [employer]Fair  Fund of Knowledge:  Fair  Language:  Fair  Akathisia:  Yes  Handed:  Right  AIMS (if indicated):     Assets:  Desire for Improvement Resilience  ADL's:  Intact  Cognition:  WNL  Sleep:       Treatment Plan Summary: Daily contact with patient to assess and evaluate symptoms and progress in treatment, Medication management and Plan : Patient is seen and examined.  Patient is a 29 year old male with the above-stated past psychiatric history who was admitted secondary to suicidal ideation.  He will be admitted to the unit.  He will be encouraged to attend groups.  He will be encouraged to work on his coping skills.  We will have to confirm his  most recent Haldol Decanoate injection.  He denied auditory or visual hallucinations currently, but we  still need to confirm the accuracy of that dosage and whether they are giving that to him every 2 weeks or every month.  We will also change his laboratories to be drawn this afternoon.  We will go on a draws Depakote level at that time as well.  That she believes it was some evidence of compliance with his Depakote.  He needs to stop using marijuana and alcohol, and I will place lorazepam 1 mg p.o. every 6 hours as needed a CIWA greater than 10 just in case that his tremor and other symptoms are secondary to alcohol withdrawal.  He does have a tremor, but negative on physical examination for cogwheeling.  I will increase his Cogentin to 1 mg p.o. 3 times daily, but I suspect that the tremor may not be necessarily related to the Haldol.  We will also contact his family for collateral information to find out exactly what was happening at home and how much of the disagreement was secondary to paranoia or issues that have underlying linked to his illness.  His vital signs are stable, he is afebrile at this point.  We will also get information with regard to his legal issues and whether or not he has any court dates coming up.  Observation Level/Precautions:  15 minute checks  Laboratory:  Chemistry Profile  Psychotherapy:    Medications:    Consultations:    Discharge Concerns:    Estimated LOS:  Other:     Physician Treatment Plan for Primary Diagnosis: <principal problem not specified> Long Term Goal(s): Improvement in symptoms so as ready for discharge  Short Term Goals: Ability to identify changes in lifestyle to reduce recurrence of condition will improve, Ability to verbalize feelings will improve, Ability to disclose and discuss suicidal ideas, Ability to demonstrate self-control will improve, Ability to identify and develop effective coping behaviors will improve, Ability to maintain clinical  measurements within normal limits will improve and Ability to identify triggers associated with substance abuse/mental health issues will improve  Physician Treatment Plan for Secondary Diagnosis: Active Problems:   Schizophrenia ([facility])   Suicidal ideation   MDD (major depressive disorder), severe ([facility])  Long Term Goal(s): Improvement in symptoms so as ready for discharge  Short Term Goals: Ability to identify changes in lifestyle to reduce recurrence of condition will improve, Ability to verbalize feelings will improve, Ability to disclose and discuss suicidal ideas, Ability to demonstrate self-control will improve, Ability to identify and develop effective coping behaviors will improve, Ability to maintain clinical measurements within normal limits will improve and Ability to identify triggers associated with substance abuse/mental health issues will improve  I certify that inpatient services furnished can reasonably be expected to improve the patient's condition.    Antonieta Pert, MD 4/11/202111:49 AM

## 2020-03-16 NOTE — Progress Notes (Signed)
Patient transferred to the adult 500 hall unit from the 200 OBS unit. Pt calm and cooperative at this time. He denies active SI/HI and AVH. Pt is currently on probation and has on an ankle bracelet monitor. Report given to receiving nurse Casimiro Needle, RN. The pt's admission and skin assessment completed by Ethelene Browns, RN.

## 2020-03-16 NOTE — H&P (Signed)
Psychiatric Admission Assessment Adult  Patient Identification: Alex Kline MRN:  [id number] Date of Evaluation:  03/16/2020 Chief Complaint:  Psych Eval Principal Diagnosis: <principal problem not specified> Diagnosis:  Active Problems:   Schizophrenia (HCC)   Suicidal ideation   MDD (major depressive disorder), severe (HCC)  History of Present Illness: Alex Kline is a 29 y.o. male who presents voluntarily and alone on this visit to [facility]. The patient states he came in today because his family put him out of his home, and he is currently living with a friend. The patient voiced he has suicidal ideation with a plan to slit his wrist or hang himself.  The patient was last admitted to [facility] in January of 2021. The patient is wearing an ankle bracelet to the left ankle. He voiced he is on probation due to him having access to a gun, and he should be off probation in June of 2021. The patient admits to Marijuana and alcohol used.  The patient reports he last used alcohol five days ago and THC 3 days ago. The patient currently attends [facility] for his psychiatric medications and sees a psychiatrist there. The patient disclosed he is presently prescribed Depakote, Haldol, and Cogentin. The patient is presenting with upper extremities tremors which he voiced started five months ago. He also expressed he has an appointment with [facility] on Apr 18, 2020. The patient was seen face-to-face on evaluation; he is alert and oriented x 3, calm, cooperative, and mood-congruent with affect. The patient is presenting with some visible bilateral upper extremities tremors. The patient does not appear to be responding to internal or external stimuli. Neither is the patient presenting with any delusional thinking. The patient denies auditory or visual hallucinations currently but admits to having a history of auditory hallucinations. The patient admits to having suicidal ideation with a plan to slit his wrist or  hang himself. He denies homicidal ideation. The patient is not presenting with any psychotic behavior but admits to experiencing paranoia behaviors. During an encounter with the patient, he was able to answer questions appropriately. Associated Signs/Symptoms: Depression Symptoms:  depressed mood, insomnia, fatigue, feelings of worthlessness/guilt, difficulty concentrating, hopelessness, recurrent thoughts of death, suicidal thoughts with specific plan, anxiety, loss of energy/fatigue, disturbed sleep, decreased appetite, (Hypo) Manic Symptoms:  Distractibility, Past behavior of sexual inappropriateness  Anxiety Symptoms:  Excessive Worry, Psychotic Symptoms:  Paranoia, PTSD Symptoms: None voiced Total Time spent with patient: 30 minutes  Past Psychiatric History:   Is the patient at risk to self? Yes.    Has the patient been a risk to self in the past 6 months? Yes.    Has the patient been a risk to self within the distant past? No.  Is the patient a risk to others? No.  Has the patient been a risk to others in the past 6 months? No.  Has the patient been a risk to others within the distant past? No.   Prior Inpatient Therapy:   Yes Prior Outpatient Therapy:  Yes  Alcohol Screening:   Substance Abuse History in the last 12 months:  Yes.   Consequences of Substance Abuse: Unknown Previous Psychotropic Medications: Yes  Psychological Evaluations: Yes  Past Medical History:  Past Medical History:  Diagnosis Date  . Bipolar disorder (HCC)   . Schizophrenia (HCC)    No past surgical history on file. Family History: No family history on file. Family Psychiatric  History: Unknown Tobacco Screening:   Social History:  Social History  Substance and Sexual Activity  Alcohol Use Yes   Comment: occ     Social History   Substance and Sexual Activity  Drug Use Yes  . Types: Marijuana    Additional Social History:      Pain Medications: Denies abuse Prescriptions:  Denies abuse Over the Counter: Denies abuse History of alcohol / drug use?: Yes Longest period of sobriety (when/how long): Unknown Name of Substance 1: Marijuana 1 - Age of First Use: Adolescent 1 - Amount (size/oz): 1 gram 1 - Frequency: Pt reports returning to use 3 days ago 1 - Duration: Ongoing 1 - Last Use / Amount: 03/13/2020 Name of Substance 2: Alcohol 2 - Age of First Use: Adolescent 2 - Amount (size/oz): 1 case of beer 2 - Frequency: Approximately 2x per week 2 - Duration: Ongoing 2 - Last Use / Amount: 03/11/2020                Allergies:  No Known Allergies Lab Results: No results found for this or any previous visit (from the past 48 hour(s)).  Blood Alcohol level:  Lab Results  Component Value Date   [facility] <10 12/26/2019   ETH <5 56/43/3295    Metabolic Disorder Labs:  Lab Results  Component Value Date   HGBA1C 5.5 12/26/2019   MPG 111.15 12/26/2019   Lab Results  Component Value Date   PROLACTIN 24.8 (H) 12/26/2019   Lab Results  Component Value Date   CHOL 134 12/26/2019   TRIG 46 12/26/2019   HDL 43 12/26/2019   CHOLHDL 3.1 12/26/2019   VLDL 9 12/26/2019   LDLCALC 82 12/26/2019    Current Medications: Current Outpatient Medications  Medication Sig Dispense Refill  . benztropine (COGENTIN) 1 MG tablet Take 1 tablet (1 mg total) by mouth 2 (two) times daily. 60 tablet 2  . divalproex (DEPAKOTE) 250 MG DR tablet Take 1 tablet (250 mg total) by mouth 3 (three) times daily. 90 tablet 2  . haloperidol (HALDOL) 5 MG tablet Take 3 tablets (15 mg total) by mouth at bedtime. 90 tablet 2  . haloperidol decanoate (HALDOL DECANOATE) 100 MG/ML injection Inject 1.5 mLs (150 mg total) into the muscle every 30 (thirty) days. Due 2/26 1 mL 11  . sucralfate (CARAFATE) 1 GM/10ML suspension Take 10 mLs (1 g total) by mouth 4 (four) times daily -  with meals and at bedtime. 420 mL 0   No current facility-administered medications for this encounter.   PTA  Medications: (Not in a hospital admission)   Musculoskeletal: Strength & Muscle Tone: within normal limits Gait & Station: normal Patient leans: N/A  Psychiatric Specialty Exam: Physical Exam  Nursing note and vitals reviewed.   Review of Systems  Blood pressure 124/61, pulse 89, temperature 98.1 F (36.7 C), temperature source Oral, resp. rate 20, SpO2 97 %.There is no height or weight on file to calculate BMI.  General Appearance: Casual  Eye Contact:  Fair  Speech:  Clear and Coherent  Volume:  Decreased  Mood:  Anxious and Depressed  Affect:  Congruent, Depressed, Flat and Labile  Thought Process:  Coherent  Orientation:  Full (Time, Place, and Person)  Thought Content:  WDL, Logical and Paranoid Ideation  Suicidal Thoughts:  Yes.  with intent/plan  Homicidal Thoughts:  No  Memory:  Immediate;   Fair Recent;   Fair Remote;   Fair  Judgement:  Poor  Insight:  Lacking  Psychomotor Activity:  Increased  Concentration:  Concentration: Fair and Attention  Span: Fair  Recall:  Good  Fund of Knowledge:  Good  Language:  Good  Akathisia:  Negative  Handed:  Right  AIMS (if indicated):     Assets:  Communication Skills Desire for Improvement Housing Intimacy Leisure Time Resilience Social Support  ADL's:  Intact  Cognition:  WNL  Sleep:       Treatment Plan Summary: Daily contact with patient to assess and evaluate symptoms and progress in treatment, Medication management and Plan The patient is a safety risk to self and does require psychiatric inpatient admission for stabilization and treatment.  Observation Level/Precautions:  15 minute checks  Laboratory:  CBC Chemistry Profile Folic Acid HbAIC UDS Vitamin B-12 TSH,   Psychotherapy:    Medications:    Consultations:    Discharge Concerns:    Estimated LOS:  Other:     Physician Treatment Plan for Primary Diagnosis: <principal problem not specified> Long Term Goal(s): Improvement in symptoms so as  ready for discharge  Short Term Goals: Ability to identify changes in lifestyle to reduce recurrence of condition will improve, Ability to verbalize feelings will improve, Ability to disclose and discuss suicidal ideas, Ability to demonstrate self-control will improve, Ability to maintain clinical measurements within normal limits will improve and Ability to identify triggers associated with substance abuse/mental health issues will improve  Physician Treatment Plan for Secondary Diagnosis: Active Problems:   Schizophrenia (HCC)   Suicidal ideation   MDD (major depressive disorder), severe (HCC)  Long Term Goal(s): Improvement in symptoms so as ready for discharge  Short Term Goals: Ability to identify changes in lifestyle to reduce recurrence of condition will improve, Ability to verbalize feelings will improve, Ability to disclose and discuss suicidal ideas, Ability to identify and develop effective coping behaviors will improve, Ability to maintain clinical measurements within normal limits will improve and Ability to identify triggers associated with substance abuse/mental health issues will improve  I certify that inpatient services furnished can reasonably be expected to improve the patient's condition.    Gillermo Murdoch, NP 4/11/20216:39 AM

## 2020-03-16 NOTE — Progress Notes (Signed)
Adult Psychoeducational Group Note  Date:  03/16/2020 Time:  10:37 PM  Group Topic/Focus:  Wrap-Up Group:   The focus of this group is to help patients review their daily goal of treatment and discuss progress on daily workbooks.  Participation Level:  Did Not Attend  Participation Quality:  Did Not Attend  Affect:  Did Not Attend  Cognitive:  Did Not Attend  Insight: None  Engagement in Group:  Did Not Attend  Modes of Intervention:  Did Not Attend  Additional Comments:  Pt did not attend evening wrap up group tonight.  Felipa Furnace 03/16/2020, 10:37 PM

## 2020-03-16 NOTE — Progress Notes (Signed)
Patient was up to the dayroom briefly and watched tv. He received trazadone 50 mg for sleep before goig to bed. His battery for his ankle monitor was switched upt. Writer asked that reason he was on probation and he reported that he was riding with a gun and will be on probation until October. Writer encouraged him to stay out of trouble and complete his probation. He is pleasant, quiet and soft spoken. Safety maintained with 15 min checks.

## 2020-03-16 NOTE — Progress Notes (Signed)
EKG completed. Pt noted to have a right hand tremor while resting in bed.

## 2020-03-16 NOTE — Progress Notes (Signed)
Patient ID: Toy Eisemann, male   DOB: [birth year], 29 y.o.   MRN: [id number]  [place] NOVEL CORONAVIRUS (COVID-19) DAILY CHECK-OFF SYMPTOMS - answer yes or no to each - every day NO YES  Have you had a fever in the past 24 hours?  . Fever (Temp > 37.80C / 100F) X   Have you had any of these symptoms in the past 24 hours? . New Cough .  Sore Throat  .  Shortness of Breath .  Difficulty Breathing .  Unexplained Body Aches   X   Have you had any one of these symptoms in the past 24 hours not related to allergies?   . Runny Nose .  Nasal Congestion .  Sneezing   X   If you have had runny nose, nasal congestion, sneezing in the past 24 hours, has it worsened?  X   EXPOSURES - check yes or no X   Have you traveled outside the state in the past 14 days?  X   Have you been in contact with someone with a confirmed diagnosis of COVID-19 or PUI in the past 14 days without wearing appropriate PPE?  X   Have you been living in the same home as a person with confirmed diagnosis of COVID-19 or a PUI (household contact)?    X   Have you been diagnosed with COVID-19?    X              What to do next: Answered NO to all: Answered YES to anything:   Proceed with unit schedule Follow the [facility] Inpatient Flowsheet.

## 2020-03-16 NOTE — BH Assessment (Signed)
Assessment Note  Alex Kline is an 29 y.o. single male who presents unaccompanied to [facility] after being transported voluntarily by Patent examiner. He has a diagnosis of schizophrenia and says he receives outpatient medication management and therapy through [facility]. Pt says he is currently suicidal with a plan to either hang himself or cut his wrists. He says he "has been put out by his family" and is staying with a friend. He denies any history of suicide attempts. He reports he has not slept at all for several days. He reports he resumed use of alcohol and drank a case of beer five days ago. He says he also resumed use of marijuana and last used three days ago. He reports feeling anxious and paranoid. He describes his mood as depressed and acknowledges symptoms including crying spells, social withdrawal, loss of interest in usual pleasures, fatigue, decreased concentration, insomnia and feelings of guilt, worthlessness and hopelessness. Pt denies any current auditory or visual hallucinations. He denies current homicidal ideation.  Pt reports he has felt stressed recently. In addition to conflicts with his family, Pt reports he is unemployed and is unable to pay his bills. He also says he feels he "has nothing to do." Pt was incarcerated for two years for convictions including assault and larceny and was released in January 2021. He is currently on probation and wearing an ankle monitor. Pt says his probation is scheduled to end 05/20/20. He denies access to firearms. Pt reports he is compliant with psychiatric medications which include Depakote, Haldol and Cogentin. He was las psychiatrically hospitalized at [facility] 01/19-01/27/21.   Pt gave TTS permission to contact his father, Alex Kline [phone], for collateral information. [name] states he has been communicating with Pt regularly and is surprised Pt came to [facility]. He says Pt has not verbalized suicidal ideation to him and that  suicidal ideation in not typical for Pt. He says he knows Pt started using marijuana and alcohol again and he expressed disappointment to Pt. He says Pt has been keeping his outpatient appointments with [facility] and to his knowledge has been taking psychiatric medications as prescribed. He says he believes Pt should be admitted inpatient.  Pt is casually dressed, alert and oriented x4. Pt speaks in a soft tone, at low volume and normal pace. Motor behavior appears tremulous and Pt says he has been experiencing tremulousness and restlessness for approximately five months. Eye contact is fair. Pt's mood is depressed and anxious, affect is congruent with mood. Thought process is coherent and relevant. Pt was polite and cooperative throughout assessment. Per medical record, Pt has a history of exposing his genitals and being sexually preoccupied but did not exhibit any of those behaviors today. He says he is willing to sign voluntarily into a psychiatric facility.   Diagnosis: F20.9 Schizophrenia  Past Medical History:  Past Medical History:  Diagnosis Date  . Bipolar disorder (HCC)   . Schizophrenia (HCC)     No past surgical history on file.  Family History: No family history on file.  Social History:  reports that he has been smoking cigarettes. He has never used smokeless tobacco. He reports current alcohol use. He reports current drug use. Drug: Marijuana.  Additional Social History:  Alcohol / Drug Use Pain Medications: Denies abuse Prescriptions: Denies abuse Over the Counter: Denies abuse History of alcohol / drug use?: Yes Longest period of sobriety (when/how long): Unknown Substance #1 Name of Substance 1: Marijuana 1 - Age of First  Use: Adolescent 1 - Amount (size/oz): 1 gram 1 - Frequency: Pt reports returning to use 3 days ago 1 - Duration: Ongoing 1 - Last Use / Amount: 03/13/2020 Substance #2 Name of Substance 2: Alcohol 2 - Age of First Use: Adolescent 2 - Amount  (size/oz): 1 case of beer 2 - Frequency: Approximately 2x per week 2 - Duration: Ongoing 2 - Last Use / Amount: 03/11/2020  CIWA: CIWA-Ar BP: 124/61 Pulse Rate: 89 COWS:    Allergies: No Known Allergies  Home Medications: (Not in a hospital admission)   OB/GYN Status:  No LMP for male patient.  General Assessment Data Location of Assessment: [facility] Services TTS Assessment: In system Is this a Tele or Face-to-Face Assessment?: Face-to-Face Is this an Initial Assessment or a Re-assessment for this encounter?: Initial Assessment Patient Accompanied by:: N/A Language Other than English: No Living Arrangements: Other (Comment)(Lives with friend) Marital status: Single Maiden name: NA Pregnancy Status: No Living Arrangements: Non-relatives/Friends Can pt return to current living arrangement?: Yes Admission Status: Voluntary Is patient capable of signing voluntary admission?: Yes Referral Source: Self/Family/Friend Insurance type: Self-pay  Medical Screening Exam [facility] Walk-in ONLY) Medical Exam completed: Yes(Jacqueline Janee Morn, NP)  Crisis Care Plan Living Arrangements: Non-relatives/Friends Legal Guardian: Other:(Self) Name of Psychiatrist: [facility] Name of Therapist: [facility]  Education Status Is patient currently in school?: No Is the patient employed, unemployed or receiving disability?: Unemployed  Risk to self with the past 6 months Suicidal Ideation: Yes-Currently Present Has patient been a risk to self within the past 6 months prior to admission? : Yes Suicidal Intent: Yes-Currently Present Has patient had any suicidal intent within the past 6 months prior to admission? : Yes Is patient at risk for suicide?: Yes Suicidal Plan?: Yes-Currently Present Has patient had any suicidal plan within the past 6 months prior to admission? : Yes Specify Current Suicidal Plan: Plan to hang himself or cut wrists Access to Means: Yes Specify Access to Suicidal  Means: Access to household items What has been your use of drugs/alcohol within the last 12 months?: Pt reports using alcohol and marijuana Previous Attempts/Gestures: No How many times?: 0 Other Self Harm Risks: None Triggers for Past Attempts: None known Intentional Self Injurious Behavior: None Family Suicide History: No Recent stressful life event(s): Conflict (Comment), Financial Problems, Legal Issues(Conflict with family) Persecutory voices/beliefs?: Yes Depression: Yes Depression Symptoms: Despondent, Insomnia, Tearfulness, Isolating, Fatigue, Guilt, Loss of interest in usual pleasures, Feeling worthless/self pity Substance abuse history and/or treatment for substance abuse?: Yes Suicide prevention information given to non-admitted patients: Not applicable  Risk to Others within the past 6 months Homicidal Ideation: No Does patient have any lifetime risk of violence toward others beyond the six months prior to admission? : Yes (comment)(Convicted of assault) Thoughts of Harm to Others: No Current Homicidal Intent: No Current Homicidal Plan: No Access to Homicidal Means: No Identified Victim: None History of harm to others?: Yes Assessment of Violence: In distant past Violent Behavior Description: Convicted of assault Does patient have access to weapons?: No Criminal Charges Pending?: No Does patient have a court date: No Is patient on probation?: Yes  Psychosis Hallucinations: None noted Delusions: Persecutory  Mental Status Report Appearance/Hygiene: Other (Comment)(Casually dressed) Eye Contact: Good Motor Activity: Tremors Speech: Soft Level of Consciousness: Alert Mood: Depressed, Anxious Affect: Depressed, Anxious Anxiety Level: Moderate Thought Processes: Coherent, Relevant Judgement: Impaired Orientation: Person, Place, Time, Situation Obsessive Compulsive Thoughts/Behaviors: None  Cognitive Functioning Concentration: Normal Memory: Recent Intact,  Remote Intact Is  patient IDD: No Insight: Fair Impulse Control: Fair Appetite: Good Have you had any weight changes? : No Change Sleep: Decreased Total Hours of Sleep: 0 Vegetative Symptoms: None  ADLScreening [facility] Assessment Services) Patient's cognitive ability adequate to safely complete daily activities?: Yes Patient able to express need for assistance with ADLs?: Yes Independently performs ADLs?: Yes (appropriate for developmental age)  Prior Inpatient Therapy Prior Inpatient Therapy: Yes Prior Therapy Dates: 12/2019, multiple admits Prior Therapy Facilty/Provider(s): [facility] Reason for Treatment: Schizophrenia  Prior Outpatient Therapy Prior Outpatient Therapy: Yes Prior Therapy Dates: Current  Prior Therapy Facilty/Provider(s): [facility] Reason for Treatment: Schizophrenia Does patient have an ACCT team?: No Does patient have Intensive In-House Services?  : No Does patient have [facility] services? : Yes Does patient have P4CC services?: No  ADL Screening (condition at time of admission) Patient's cognitive ability adequate to safely complete daily activities?: Yes Is the patient deaf or have difficulty hearing?: No Does the patient have difficulty seeing, even when wearing glasses/contacts?: No Does the patient have difficulty concentrating, remembering, or making decisions?: No Patient able to express need for assistance with ADLs?: Yes Does the patient have difficulty dressing or bathing?: No Independently performs ADLs?: Yes (appropriate for developmental age) Does the patient have difficulty walking or climbing stairs?: No Weakness of Legs: None Weakness of Arms/Hands: None  Home Assistive Devices/Equipment Home Assistive Devices/Equipment: None    Abuse/Neglect Assessment (Assessment to be complete while patient is alone) Abuse/Neglect Assessment Can Be Completed: Yes Physical Abuse: Denies Verbal Abuse: Denies Sexual Abuse: Denies Exploitation of  patient/patient's resources: Denies Self-Neglect: Denies     Regulatory affairs officer (For Healthcare) Does Patient Have a Medical Advance Directive?: No Would patient like information on creating a medical advance directive?: No - Patient declined          Disposition: Caroline Sauger, NP participated in assessment, completed MSE and recommended inpatient psychiatric treatment. Lavell Luster, [facility] confirmed bed availability. Pt accepted to room 506-1.  Disposition Initial Assessment Completed for this Encounter: Yes Disposition of Patient: Admit Type of inpatient treatment program: Adult Patient refused recommended treatment: No  On Site Evaluation by:  Caroline Sauger, NP Reviewed with Physician:    Evelena Peat, [facility], [facility] Triage Specialist [phone]  Anson Fret, Orpah Greek 03/16/2020 6:46 AM

## 2020-03-16 NOTE — Progress Notes (Signed)
Initial Treatment Plan 03/16/2020 9:10 AM [name] SQS:[id number]    PATIENT STRESSORS: Financial difficulties Marital or family conflict Occupational concerns   PATIENT STRENGTHS: Motivation for treatment/growth Supportive family/friends   PATIENT IDENTIFIED PROBLEMS: Anxiety  Depression  Unemployment  Family conflict "put out by my family"               DISCHARGE CRITERIA:  Improved stabilization in mood, thinking, and/or behavior Reduction of life-threatening or endangering symptoms to within safe limits Verbal commitment to aftercare and medication compliance  PRELIMINARY DISCHARGE PLAN: Outpatient therapy Return to previous living arrangement  PATIENT/FAMILY INVOLVEMENT: This treatment plan has been presented to and reviewed with the patient, Adarian Bur, and/or family member.  The patient and family have been given the opportunity to ask questions and make suggestions.  Tania Ade, RN 03/16/2020, 9:10 AM

## 2020-03-16 NOTE — [facility] Group Notes (Signed)
[facility] LCSW Group Therapy Note  Date/Time:  03/16/2020 11:15A-12:00P  Type of Therapy and Topic:  Group Therapy:  Healthy and Unhealthy Supports  Participation Level:  Active   Description of Group:  Patients in this group were introduced to the idea of adding a variety of healthy supports to address the various needs in their lives.Patients discussed what additional healthy supports could be helpful in their recovery and wellness after discharge in order to prevent future hospitalizations.   An emphasis was placed on using counselor, doctor, therapy groups, 12-step groups, and problem-specific support groups to expand supports.  They also worked as a group on developing a specific plan for several patients to deal with unhealthy supports through boundary-setting, psychoeducation with loved ones, and even termination of relationships.   Therapeutic Goals:   1)  discuss importance of adding supports to stay well once out of the hospital  2)  compare healthy versus unhealthy supports and identify some examples of each  3)  generate ideas and descriptions of healthy supports that can be added  4)  offer mutual support about how to address unhealthy supports  5)  encourage active participation in and adherence to discharge plan    Summary of Patient Progress:  The patient engaged in introductory check-in, sharing of feeling "Stressed out". Pt continued to engage in introduction discussion surrounding pt's definition of support, defining as "there for me". Pt stated that current healthy supports in his life are his grandmother and his dad while current unhealthy supports include his disability due to impairing his abilities to do things he wants to such as work.  The patient did not identify any individuals to add to his support system. Pt proved receptive of alternate group members input and feedback provided by CSW.   Therapeutic Modalities:   Motivational Interviewing Brief  Solution-Focused Therapy  Leisa Lenz, LCSWA 03/16/2020  2:06 PM

## 2020-03-16 NOTE — Progress Notes (Signed)
   03/16/20 1943  COVID-19 Daily Checkoff  Have you had a fever (temp > 37.80C/100F)  in the past 24 hours?  No  If you have had runny nose, nasal congestion, sneezing in the past 24 hours, has it worsened? No  COVID-19 EXPOSURE  Have you traveled outside the state in the past 14 days? No  Have you been in contact with someone with a confirmed diagnosis of COVID-19 or PUI in the past 14 days without wearing appropriate PPE? No  Have you been living in the same home as a person with confirmed diagnosis of COVID-19 or a PUI (household contact)? No  Have you been diagnosed with COVID-19? No

## 2020-03-16 NOTE — Progress Notes (Signed)
Patient is currently lying in bed resting. He reports having had a good day and is not in need of anything now. He denies having pain, -si/hi/ auditory or visual hallucinations. He reports that he will seek staff if any changes. Safety maintained with 15 min checks.

## 2020-03-17 DIAGNOSIS — F322 Major depressive disorder, single episode, severe without psychotic features: Secondary | ICD-10-CM

## 2020-03-17 LAB — TSH: TSH: 1.589 u[IU]/mL (ref 0.350–4.500)

## 2020-03-17 MED ORDER — FLUOXETINE HCL 20 MG PO CAPS
20.0000 mg | ORAL_CAPSULE | Freq: Every day | ORAL | Status: DC
  Administered 2020-03-17 – 2020-03-19 (×3): 20 mg via ORAL
  Filled 2020-03-17 (×4): qty 1
  Filled 2020-03-17: qty 10
  Filled 2020-03-17: qty 1

## 2020-03-17 NOTE — Plan of Care (Signed)
Progress note  D: pt found in bed; compliant with medication administration. Pt states they slept fair. Pt rates their depression/hopelessness/anxiety a 7/6/3 out of 10 respectively. Pt has no physical complaints. Pt states their goal for today is to focus on concentrating. Pt states they did have thoughts of hurting themself yesterday but hasn't today. Pt provided alternative ways to cope with these feelings if they do arise. Pt denies si/hi/ah/vh and verbally agrees to approach staff if these become apparent or before harming themself/others while at bhh.  A: Pt provided support and encouragement. Pt given medication per protocol and standing orders. Q22m safety checks implemented and continued.  R: Pt safe on the unit. Will continue to monitor.  Pt progressing in the following metrics  Problem: Education: Goal: Knowledge of [facility] Education information/materials will improve Outcome: Progressing Goal: Emotional status will improve Outcome: Progressing Goal: Mental status will improve Outcome: Progressing Goal: Verbalization of understanding the information provided will improve Outcome: Progressing

## 2020-03-17 NOTE — B[facility] Counselor (Signed)
CSW spoke with [facility], Sharen Counter [phone]). Care Coordinator was updated of patient's inpatient admission. Care Coordinator confirms that patient is established with [facility] and has appointments scheduled for 04/14 and 04/29.  Enid Cutter, MSW, LCSW-A Clinical Social Worker [facility]

## 2020-03-17 NOTE — Progress Notes (Signed)
[facility] MD Progress Note  03/17/2020 11:41 AM Alex Kline  MRN:  [id number] Subjective:   Patient seen in morning rounds he is also seen in team meeting he endorsed suicidal thoughts without plans and could contract for safety in rounds, my team meeting a longer had suicidal thoughts.  He denied auditory or visual hallucinations somewhat flat in affect and negative symptoms are prominent. Principal Problem: MDD SI Diagnosis: Active Problems:   Schizophrenia (HCC)   Suicidal ideation   MDD (major depressive disorder), severe (HCC)  Total Time spent with patient: 20 minutes  Past Psychiatric History: Last admitted in January  Past Medical History:  Past Medical History:  Diagnosis Date  . Bipolar disorder (HCC)   . Schizophrenia (HCC)    History reviewed. No pertinent surgical history. Family History: History reviewed. No pertinent family history. Family Psychiatric  History:  Social History:  Social History   Substance and Sexual Activity  Alcohol Use Yes   Comment: occ     Social History   Substance and Sexual Activity  Drug Use Yes  . Types: Marijuana    Social History   Socioeconomic History  . Marital status: Single    Spouse name: Not on file  . Number of children: Not on file  . Years of education: Not on file  . Highest education level: Not on file  Occupational History  . Not on file  Tobacco Use  . Smoking status: Current Every Day Smoker    Types: Cigarettes  . Smokeless tobacco: Never Used  Substance and Sexual Activity  . Alcohol use: Yes    Comment: occ  . Drug use: Yes    Types: Marijuana  . Sexual activity: Not on file  Other Topics Concern  . Not on file  Social History Narrative  . Not on file   Social Determinants of Health   Financial Resource Strain:   . Difficulty of Paying Living Expenses:   Food Insecurity:   . Worried About Programme researcher, broadcasting/film/video in the Last Year:   . Barista in the Last Year:   Transportation Needs:    . Freight forwarder (Medical):   Marland Kitchen Lack of Transportation (Non-Medical):   Physical Activity:   . Days of Exercise per Week:   . Minutes of Exercise per Session:   Stress:   . Feeling of Stress :   Social Connections:   . Frequency of Communication with Friends and Family:   . Frequency of Social Gatherings with Friends and Family:   . Attends Religious Services:   . Active Member of Clubs or Organizations:   . Attends Banker Meetings:   Marland Kitchen Marital Status:    Additional Social History:    Pain Medications: Denies abuse Prescriptions: Denies abuse Over the Counter: Denies abuse History of alcohol / drug use?: Yes Longest period of sobriety (when/how long): Unknown Name of Substance 1: Marijuana 1 - Age of First Use: Adolescent 1 - Amount (size/oz): 1 gram 1 - Frequency: Pt reports returning to use 3 days ago 1 - Duration: Ongoing 1 - Last Use / Amount: 03/13/2020 Name of Substance 2: Alcohol 2 - Age of First Use: Adolescent 2 - Amount (size/oz): 1 case of beer 2 - Frequency: Approximately 2x per week 2 - Duration: Ongoing 2 - Last Use / Amount: 03/11/2020                Sleep: Fair  Appetite:  Fair  Current Medications: Current  Facility-Administered Medications  Medication Dose Route Frequency Provider Last Rate Last Admin  . acetaminophen (TYLENOL) tablet 650 mg  650 mg Oral Q6H PRN Gillermo Murdoch, NP      . alum & mag hydroxide-simeth (MAALOX/MYLANTA) 200-200-20 MG/5ML suspension 30 mL  30 mL Oral Q4H PRN Gillermo Murdoch, NP      . benztropine (COGENTIN) tablet 1 mg  1 mg Oral TID Antonieta Pert, MD   1 mg at 03/17/20 1119  . divalproex (DEPAKOTE) DR tablet 250 mg  250 mg Oral TID Gillermo Murdoch, NP   250 mg at 03/17/20 1119  . FLUoxetine (PROZAC) capsule 20 mg  20 mg Oral Daily Malvin Johns, MD      . folic acid (FOLVITE) tablet 1 mg  1 mg Oral Daily Antonieta Pert, MD   1 mg at 03/17/20 0738  . LORazepam (ATIVAN)  tablet 1 mg  1 mg Oral Q6H PRN Antonieta Pert, MD      . OLANZapine zydis [facility]) disintegrating tablet 10 mg  10 mg Oral Q8H PRN Antonieta Pert, MD       And  . LORazepam (ATIVAN) tablet 1 mg  1 mg Oral PRN Antonieta Pert, MD       And  . ziprasidone (GEODON) injection 20 mg  20 mg Intramuscular PRN Antonieta Pert, MD      . magnesium hydroxide (MILK OF MAGNESIA) suspension 30 mL  30 mL Oral Daily PRN Gillermo Murdoch, NP      . omega-3 acid ethyl esters (LOVAZA) capsule 1 g  1 g Oral BID Antonieta Pert, MD   1 g at 03/17/20 0738  . thiamine tablet 100 mg  100 mg Oral Daily Antonieta Pert, MD   100 mg at 03/17/20 0738  . traZODone (DESYREL) tablet 50 mg  50 mg Oral QHS PRN Antonieta Pert, MD   50 mg at 03/16/20 2155    Lab Results:  Results for orders placed or performed during the hospital encounter of 03/16/20 (from the past 48 hour(s))  Respiratory Panel by RT PCR (Flu A&B, Covid) - Nasopharyngeal Swab     Status: None   Collection Time: 03/16/20  6:39 AM   Specimen: Nasopharyngeal Swab  Result Value Ref Range   SARS Coronavirus 2 by RT PCR NEGATIVE NEGATIVE    Comment: (NOTE) SARS-CoV-2 target nucleic acids are NOT DETECTED. The SARS-CoV-2 RNA is generally detectable in upper respiratoy specimens during the acute phase of infection. The lowest concentration of SARS-CoV-2 viral copies this assay can detect is 131 copies/mL. A negative result does not preclude SARS-Cov-2 infection and should not be used as the sole basis for treatment or other patient management decisions. A negative result may occur with  improper specimen collection/handling, submission of specimen other than nasopharyngeal swab, presence of viral mutation(s) within the areas targeted by this assay, and inadequate number of viral copies (<131 copies/mL). A negative result must be combined with clinical observations, patient history, and epidemiological information. The expected  result is Negative. Fact Sheet for Patients:  https://www.moore.com/ Fact Sheet for Healthcare Providers:  https://www.young.biz/ This test is not yet ap proved or cleared by the [place] FDA and  has been authorized for detection and/or diagnosis of SARS-CoV-2 by FDA under an Emergency Use Authorization (EUA). This EUA will remain  in effect (meaning this test can be used) for the duration of the COVID-19 declaration under Section 564(b)(1) of the Act, 21 U.S.C. section 360bbb-3(b)(1), unless  the authorization is terminated or revoked sooner.    Influenza A by PCR NEGATIVE NEGATIVE   Influenza B by PCR NEGATIVE NEGATIVE    Comment: (NOTE) The Xpert Xpress SARS-CoV-2/FLU/RSV assay is intended as an aid in  the diagnosis of influenza from Nasopharyngeal swab specimens and  should not be used as a sole basis for treatment. Nasal washings and  aspirates are unacceptable for Xpert Xpress SARS-CoV-2/FLU/RSV  testing. Fact Sheet for Patients: PinkCheek.be Fact Sheet for Healthcare Providers: GravelBags.it This test is not yet approved or cleared by the [place] FDA and  has been authorized for detection and/or diagnosis of SARS-CoV-2 by  FDA under an Emergency Use Authorization (EUA). This EUA will remain  in effect (meaning this test can be used) for the duration of the  Covid-19 declaration under Section 564(b)(1) of the Act, 21  U.S.C. section 360bbb-3(b)(1), unless the authorization is  terminated or revoked. Performed at [facility], [place] [address] [place], [place] [address]   CBC with Differential/Platelet     Status: None   Collection Time: 03/16/20  6:14 PM  Result Value Ref Range   WBC 5.1 4.0 - 10.5 K/uL   RBC 5.19 4.22 - 5.81 MIL/uL   Hemoglobin 13.9 13.0 - 17.0 g/dL   HCT 44.7 39.0 - 52.0 %   MCV 86.1 80.0 - 100.0 fL   MCH 26.8 26.0 - 34.0 pg    MCHC 31.1 30.0 - 36.0 g/dL   RDW 14.5 11.5 - 15.5 %   Platelets 202 150 - 400 K/uL   nRBC 0.0 0.0 - 0.2 %   Neutrophils Relative % 38 %   Neutro Abs 1.9 1.7 - 7.7 K/uL   Lymphocytes Relative 51 %   Lymphs Abs 2.6 0.7 - 4.0 K/uL   Monocytes Relative 9 %   Monocytes Absolute 0.5 0.1 - 1.0 K/uL   Eosinophils Relative 2 %   Eosinophils Absolute 0.1 0.0 - 0.5 K/uL   Basophils Relative 0 %   Basophils Absolute 0.0 0.0 - 0.1 K/uL   Immature Granulocytes 0 %   Abs Immature Granulocytes 0.02 0.00 - 0.07 K/uL    Comment: Performed at [facility], [place] [address], [place], [place] [address]  Comprehensive metabolic panel     Status: Abnormal   Collection Time: 03/16/20  6:14 PM  Result Value Ref Range   Sodium 139 135 - 145 mmol/L   Potassium 3.5 3.5 - 5.1 mmol/L   Chloride 104 98 - 111 mmol/L   CO2 26 22 - 32 mmol/L   Glucose, Bld 91 70 - 99 mg/dL    Comment: Glucose reference range applies only to samples taken after fasting for at least 8 hours.   BUN 11 6 - 20 mg/dL   Creatinine, Ser 1.14 0.61 - 1.24 mg/dL   Calcium 9.0 8.9 - 10.3 mg/dL   Total Protein 7.0 6.5 - 8.1 g/dL   Albumin 4.3 3.5 - 5.0 g/dL   AST 13 (L) 15 - 41 U/L   ALT 11 0 - 44 U/L   Alkaline Phosphatase 69 38 - 126 U/L   Total Bilirubin 0.7 0.3 - 1.2 mg/dL   GFR calc non Af Amer >60 >60 mL/min   GFR calc Af Amer >60 >60 mL/min   Anion gap 9 5 - 15    Comment: Performed at [facility], [place] [address] [place], [place] [address]  TSH     Status: None   Collection  Time: 03/16/20  6:14 PM  Result Value Ref Range   TSH 1.589 0.350 - 4.500 uIU/mL    Comment: Performed by a 3rd Generation assay with a functional sensitivity of <=0.01 uIU/mL. Performed at [facility], [address] [place], [place] [address]   Valproic acid level     Status: Abnormal   Collection Time: 03/16/20  6:14 PM  Result Value Ref Range   Valproic Acid Lvl 41 (L) 50.0 - 100.0 ug/mL     Comment: Performed at [facility], [address] [place], [place] [address]    Blood Alcohol level:  Lab Results  Component Value Date   [facility] <10 12/26/2019   ETH <5 07/13/2015    Metabolic Disorder Labs: Lab Results  Component Value Date   HGBA1C 5.5 12/26/2019   MPG 111.15 12/26/2019   Lab Results  Component Value Date   PROLACTIN 24.8 (H) 12/26/2019   Lab Results  Component Value Date   CHOL 134 12/26/2019   TRIG 46 12/26/2019   HDL 43 12/26/2019   CHOLHDL 3.1 12/26/2019   VLDL 9 12/26/2019   LDLCALC 82 12/26/2019    Physical Findings: AIMS: Facial and Oral Movements Muscles of Facial Expression: None, normal Lips and Perioral Area: None, normal Jaw: None, normal Tongue: None, normal,Extremity Movements Upper (arms, wrists, hands, fingers): Mild Lower (legs, knees, ankles, toes): None, normal, Trunk Movements Neck, shoulders, hips: None, normal, Overall Severity Severity of abnormal movements (highest score from questions above): Moderate Incapacitation due to abnormal movements: Minimal Patient's awareness of abnormal movements (rate only patient's report): Aware, mild distress, Dental Status Current problems with teeth and/or dentures?: No Does patient usually wear dentures?: No  CIWA:  CIWA-Ar Total: 3 COWS:  COWS Total Score: 7  Musculoskeletal: Strength & Muscle Tone: within normal limits Gait & Station: normal Patient leans: N/A  Psychiatric Specialty Exam: Physical Exam  Review of Systems  Blood pressure 95/68, pulse (!) 59, temperature 98.1 F (36.7 C), temperature source Oral, resp. rate 18, height 5\' 9"  (1.753 m), weight 72.6 kg, SpO2 99 %.Body mass index is 23.63 kg/m.  General Appearance: Casual  Eye Contact:  Fair  Speech:  Slow  Volume:  Decreased  Mood:  Dysphoric  Affect:  Blunt  Thought Process:  Goal Directed  Orientation:  Full (Time, Place, and Person)  Thought Content:  Logical  Suicidal Thoughts:  No  at present / variable response earlier  Homicidal Thoughts:  No  Memory:  Immediate;   Fair Recent;   Fair Remote;   Fair  Judgement:  Fair  Insight:  Fair  Psychomotor Activity:  Normal  Concentration:  Concentration: Fair and Attention Span: Fair  Recall:  of Knowledge:  Fair  Language:  Fair  Akathisia:  Negative  Handed:  Right  AIMS (if indicated):     Assets:  Leisure Time Physical Health  ADL's:  Intact  Cognition:  WNL  Sleep:  Number of Hours: 6     Treatment Plan Summary: Daily contact with patient to assess and evaluate symptoms and progress in treatment and Medication management  Continue current reality based therapy Continue to monitor for safety no change in precautions probable discharge 2-3 days   [employer], MD 03/17/2020, 11:41 AM

## 2020-03-17 NOTE — Progress Notes (Signed)
Recreation Therapy Notes  Patient admitted to unit 4.11.21. Due to admission within last year, no new assessment conducted at this time. Last assessment conducted 1.21.21. Patient states reason for current admission is SI.  Patient reports family not getting back together, bills and not having anything to do as stressors.  Patient reports coping skills continue to be isolation, journal, writing, sports, tv, music, exercise, meditate, deep breathing, impulsivity, prayer, avoidance and hot bath/shower.  Leisure interests are still reading, eating and watching Youtube.  Patient identifies strengths as getting along with others, trying to stay stress free and being friendly.  Patient identified no areas of improvement.   Patient denies SI, HI, AVH at this time. Patient reports goal of "find my inner emotion".    Caroll Rancher, LRT/CTRS    Caroll Rancher A 03/17/2020 11:57 AM

## 2020-03-17 NOTE — Tx Team (Cosign Needed)
Interdisciplinary Treatment and Diagnostic Plan Update  03/17/2020 Time of Session: Alex Kline MRN: [id number]  Principal Diagnosis: <principal problem not specified>  Secondary Diagnoses: Active Problems:   Schizophrenia ([place])   Suicidal ideation   MDD (major depressive disorder), severe (H[facility])   Current Medications:  Current Facility-Administered Medications  Medication Dose Route Frequency Provider Last Rate Last Admin  . acetaminophen (TYLENOL) tablet 650 mg  650 mg Oral Q6H PRN Caroline Sauger, NP      . alum & mag hydroxide-simeth (MAALOX/MYLANTA) 200-200-20 MG/5ML suspension 30 mL  30 mL Oral Q4H PRN Caroline Sauger, NP      . benztropine (COGENTIN) tablet 1 mg  1 mg Oral TID Sharma Covert, MD   1 mg at 03/17/20 0738  . divalproex (DEPAKOTE) DR tablet 250 mg  250 mg Oral TID Caroline Sauger, NP   250 mg at 03/17/20 0738  . folic acid (FOLVITE) tablet 1 mg  1 mg Oral Daily Sharma Covert, MD   1 mg at 03/17/20 0738  . LORazepam (ATIVAN) tablet 1 mg  1 mg Oral Q6H PRN Sharma Covert, MD      . OLANZapine zydis [facility]) disintegrating tablet 10 mg  10 mg Oral Q8H PRN Sharma Covert, MD       And  . LORazepam (ATIVAN) tablet 1 mg  1 mg Oral PRN Sharma Covert, MD       And  . ziprasidone (GEODON) injection 20 mg  20 mg Intramuscular PRN Sharma Covert, MD      . magnesium hydroxide (MILK OF MAGNESIA) suspension 30 mL  30 mL Oral Daily PRN Caroline Sauger, NP      . omega-3 acid ethyl esters (LOVAZA) capsule 1 g  1 g Oral BID Sharma Covert, MD   1 g at 03/17/20 0738  . thiamine tablet 100 mg  100 mg Oral Daily Sharma Covert, MD   100 mg at 03/17/20 0738  . traZODone (DESYREL) tablet 50 mg  50 mg Oral QHS PRN Sharma Covert, MD   50 mg at 03/16/20 2155   PTA Medications: Medications Prior to Admission  Medication Sig Dispense Refill Last Dose  . benztropine (COGENTIN) 1 MG tablet Take 1 tablet (1 mg total) by  mouth 2 (two) times daily. 60 tablet 2   . divalproex (DEPAKOTE) 250 MG DR tablet Take 1 tablet (250 mg total) by mouth 3 (three) times daily. 90 tablet 2   . haloperidol (HALDOL) 5 MG tablet Take 3 tablets (15 mg total) by mouth at bedtime. 90 tablet 2   . haloperidol decanoate (HALDOL DECANOATE) 100 MG/ML injection Inject 1.5 mLs (150 mg total) into the muscle every 30 (thirty) days. Due 2/26 1 mL 11 03/06/2020    Patient Stressors: Financial difficulties Marital or family conflict Occupational concerns  Patient Strengths: Motivation for treatment/growth Supportive family/friends  Treatment Modalities: Medication Management, Group therapy, Case management,  1 to 1 session with clinician, Psychoeducation, Recreational therapy.   Physician Treatment Plan for Primary Diagnosis: <principal problem not specified> Long Term Goal(s): Improvement in symptoms so as ready for discharge Improvement in symptoms so as ready for discharge   Short Term Goals: Ability to identify changes in lifestyle to reduce recurrence of condition will improve Ability to verbalize feelings will improve Ability to disclose and discuss suicidal ideas Ability to demonstrate self-control will improve Ability to identify and develop effective coping behaviors will improve Ability to maintain clinical measurements within normal limits  will improve Ability to identify triggers associated with substance abuse/mental health issues will improve Ability to identify changes in lifestyle to reduce recurrence of condition will improve Ability to verbalize feelings will improve Ability to disclose and discuss suicidal ideas Ability to demonstrate self-control will improve Ability to identify and develop effective coping behaviors will improve Ability to maintain clinical measurements within normal limits will improve Ability to identify triggers associated with substance abuse/mental health issues will improve  Medication  Management: Evaluate patient's response, side effects, and tolerance of medication regimen.  Therapeutic Interventions: 1 to 1 sessions, Unit Group sessions and Medication administration.  Evaluation of Outcomes: Not Met  Physician Treatment Plan for Secondary Diagnosis: Active Problems:   Schizophrenia ([place])   Suicidal ideation   MDD (major depressive disorder), severe ([place])  Long Term Goal(s): Improvement in symptoms so as ready for discharge Improvement in symptoms so as ready for discharge   Short Term Goals: Ability to identify changes in lifestyle to reduce recurrence of condition will improve Ability to verbalize feelings will improve Ability to disclose and discuss suicidal ideas Ability to demonstrate self-control will improve Ability to identify and develop effective coping behaviors will improve Ability to maintain clinical measurements within normal limits will improve Ability to identify triggers associated with substance abuse/mental health issues will improve Ability to identify changes in lifestyle to reduce recurrence of condition will improve Ability to verbalize feelings will improve Ability to disclose and discuss suicidal ideas Ability to demonstrate self-control will improve Ability to identify and develop effective coping behaviors will improve Ability to maintain clinical measurements within normal limits will improve Ability to identify triggers associated with substance abuse/mental health issues will improve     Medication Management: Evaluate patient's response, side effects, and tolerance of medication regimen.  Therapeutic Interventions: 1 to 1 sessions, Unit Group sessions and Medication administration.  Evaluation of Outcomes: Not Met   RN Treatment Plan for Primary Diagnosis: <principal problem not specified> Long Term Goal(s): Knowledge of disease and therapeutic regimen to maintain health will improve  Short Term Goals: Ability to remain free  from injury will improve, Ability to participate in decision making will improve, Ability to verbalize feelings will improve, Ability to identify and develop effective coping behaviors will improve and Compliance with prescribed medications will improve  Medication Management: RN will administer medications as ordered by provider, will assess and evaluate patient's response and provide education to patient for prescribed medication. RN will report any adverse and/or side effects to prescribing provider.  Therapeutic Interventions: 1 on 1 counseling sessions, Psychoeducation, Medication administration, Evaluate responses to treatment, Monitor vital signs and CBGs as ordered, Perform/monitor CIWA, COWS, AIMS and Fall Risk screenings as ordered, Perform wound care treatments as ordered.  Evaluation of Outcomes: Not Met   LCSW Treatment Plan for Primary Diagnosis: <principal problem not specified> Long Term Goal(s): Safe transition to appropriate next level of care at discharge, Engage patient in therapeutic group addressing interpersonal concerns.  Short Term Goals: Engage patient in aftercare planning with referrals and resources, Increase emotional regulation, Facilitate acceptance of mental health diagnosis and concerns and Increase skills for wellness and recovery  Therapeutic Interventions: Assess for all discharge needs, 1 to 1 time with Social worker, Explore available resources and support systems, Assess for adequacy in community support network, Educate family and significant other(s) on suicide prevention, Complete Psychosocial Assessment, Interpersonal group therapy.  Evaluation of Outcomes: Not Met   Progress in Treatment: Attending groups: Yes. Participating in groups: Yes. Taking medication  as prescribed: Yes. Toleration medication: Yes. Family/Significant other contact made: No, will contact:  supports if consents are granted Patient understands diagnosis: Yes. Discussing  patient identified problems/goals with staff: Yes. Medical problems stabilized or resolved: Yes. Denies suicidal/homicidal ideation: Yes. Issues/concerns per patient self-inventory: Yes. Other:   New problem(s) identified: Yes, Describe:  Unstable housing   New Short Term/Long Term Goal(s): Be a better person  Patient Goals:    Discharge Plan or Barriers: newly admitted to unit, CSW assessing for appropriate referrals   Reason for Continuation of Hospitalization: Anxiety Depression Medication stabilization  Estimated Length of Stay: 3-5  Attendees: Patient: Alex Kline 03/17/2020 10:44 AM  Physician: Dr. Jake Samples 03/17/2020 10:44 AM  Nursing: Legrand Como  03/17/2020 10:44 AM  RN Care Manager: 03/17/2020 10:44 AM  Social Worker: Stephanie Acre 03/17/2020 10:44 AM  Recreational Therapist:  03/17/2020 10:44 AM  Other: Macon Large, [place] 03/17/2020 10:44 AM  Other:  03/17/2020 10:44 AM  Other: 03/17/2020 10:44 AM    Scribe for Treatment Team: Bethann Berkshire, LCSW 03/17/2020 10:44 AM

## 2020-03-17 NOTE — [facility] Group Notes (Signed)
LCSW Aftercare Discharge Planning Group Note  03/17/2020   Type of Group and Topic: Psychoeducational Group: Discharge Planning  Participation Level: Active  Description of Group  Discharge planning group reviews patient's anticipated discharge plans and assists patients to anticipate and address any barriers to wellness/recovery in the community. Suicide prevention education is reviewed with patients in group.  Therapeutic Goals  1. Patients will state their anticipated discharge plan and mental health aftercare  2. Patients will identify potential barriers to wellness in the community setting  3. Patients will engage in problem solving, solution focused discussion of ways to anticipate and address barriers to wellness/recovery  Summary of Patient Progress  Plan for Discharge/Comments: Patient remained present throughout group and actively participated. He is established with [facility] and has an appointment later this week.  Transportation Means: Has transportation.  Supports: Grandma, dad, and his sister.  Therapeutic Modalities:  Motivational Interviewing  Darreld Mclean, [place]  03/17/2020 2:20 PM

## 2020-03-17 NOTE — [facility] Counselor (Signed)
Adult Comprehensive Assessment  Patient ID: Alex Kline, male   DOB: 1991-11-08, 29 y.o.   MRN: [id number]     Information Source: Information source: Patient  Current Stressors: Patient states their primary concerns and needs for treatment are:: Experiencing passive SI without a plan.  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 He reports he was unable to maintain his employment due to tremors. Family Relationships: Gets along well with his sister, has a strained relationship with mother. Financial / Lack of resources (include bankruptcy): No income, no insurance. Housing / Lack of housing: Has his own apartment, but the neighborhood isn't that great Physical health (include injuries & life threatening diseases): Reports experiencing tremors in his hands for the past 4-5 months, he is unsure of why. Social relationships: Talks to people online, is single. Substance abuse: Reports he used THC 3 days ago, but doesn't smoke often because "it is too strong." Bereavement / Loss: Feels he is grieving for his health/strength.  Living/Environment/Situation: Living Arrangements: Rents an apartment in [place] Who else lives in the home?: Self How long has patient lived in the home? Since February Comfortable, Sometimes unsafe  Family History: Marital status: Single Are you sexually active?: Yes What is your sexual orientation?: heterosexual Does patient have children?: Yes How many children?: 1 How is patient's relationship with their children?: Has a 33 year old son who he does not have a relationship with.  Childhood History: By whom was/is the patient raised?: Both parents Description of patient's relationship with caregiver when they were a child: "good" Patient's description  of current relationship with people who raised him/her: "not so good" How were you disciplined when you got in trouble as a child/adolescent?: whippings ans time out Does patient have siblings?: Yes Number of Siblings: 3 Description of patient's current relationship with siblings: "alrights" Did patient suffer any verbal/emotional/physical/sexual abuse as a child?: No Did patient suffer from severe childhood neglect?: No Has patient ever been sexually abused/assaulted/raped as an adolescent or adult?: No Was the patient ever a victim of a crime or a disaster?: No Witnessed domestic violence?: No Has patient been effected by domestic violence as an adult?: No  Education: Highest grade of school patient has completed: GED Currently a Consulting civil engineer?: No Learning disability?: No  Employment/Work Situation: Employment situation: Unemployed What is the longest time patient has a held a job?: 6-7 months Where was the patient employed at that time?: [employer] Are There Guns or Other Weapons in Your Home?: No  Financial Resources: Financial resources: No income, no insurance  Alcohol/Substance Abuse: What has been your use of drugs/alcohol within the last 12 months?: Endorses used THC once 3 days ago. Denies other.  Alcohol/Substance Abuse Treatment Hx: Denies past history  Social Support System: Patient's Community Support System: Good Describe Community Support System: Sister, father, grandmother. Type of faith/religion: Muslim  Leisure/Recreation: Leisure and Hobbies: draw  Strengths/Needs: What is the patient's perception of their strengths?: "talking to girls"  Discharge Plan: Currently receiving community mental health services: Yes, established with [facility] for outpatient medication management and therapy. Patient states concerns and preferences for aftercare planning are: Wishes to continue with the same provider. Patient states they will know when they  are safe and ready for discharge when: "feel calm" Does patient have access to transportation?: Yes Will patient  be returning to same living situation after discharge?: Yes, returning home.   Summary/Recommendations:   Summary and Recommendations (to be completed by the evaluator): Alex Kline is a 29 year old male from [facility] he presents [facility] to [facility] seeking treatment for passive SI without a plan. Patient is diagnosed with schizophrenia. He endorses financial and family stressors. Patient was last inpatient at [facility] in January 2021. While here, patient can benefit from crisis stabilization, medication management, therapeutic milieu, and referrals for services.  Joellen Jersey. 03/17/2020

## 2020-03-17 NOTE — Progress Notes (Signed)
Recreation Therapy Notes  Date: 4.12.21 Time: 1000 Location: 500 Hall Dayroom  Group Topic: Coping Skills  Goal Area(s) Addresses:  Patient will identify positive coping skills. Patient will identify benefit of using coping skills post d/c.  Behavioral Response: Engaged  Intervention: Game  Activity: Chartered loss adjuster.  LRT and patients played Jenga as the instructions described, in addition, patients were to identify a positive coping skill.  Patients and LRT could not repeat coping skills that had already been identify.  When the tower fell over, the game would restart and all coping skills were up for grabs.  Education: Pharmacologist, Building control surveyor.   Education Outcome: Acknowledges understanding/In group clarification offered/Needs additional education.   Clinical Observations/Feedback: Pt was active and engaged in activity.  Pt was appropriate during group session.  Pt identified coping skills as smoking a cigarette, cooking, talking to family and playing a game.      Caroll Rancher, LRT/CTRS    Lillia Abed, Taylore Hinde A 03/17/2020 11:40 AM

## 2020-03-17 NOTE — Progress Notes (Signed)
   03/17/20 2100  Psych Admission Type (Psych Patients Only)  Admission Status Voluntary  Psychosocial Assessment  Patient Complaints Anxiety  Eye Contact Fair  Facial Expression Pensive  Affect Anxious;Appropriate to circumstance;Preoccupied  Speech Logical/coherent;Soft  Interaction Avoidant;Forwards little;Guarded;Minimal  Motor Activity Tremors  Appearance/Hygiene Unremarkable  Behavior Characteristics Cooperative  Mood Preoccupied  Thought Process  Coherency WDL  Content WDL  Delusions None reported or observed  Perception WDL  Hallucination None reported or observed  Judgment Poor  Confusion None  Danger to Self  Current suicidal ideation? Denies  Danger to Others  Danger to Others None reported or observed

## 2020-03-18 NOTE — Progress Notes (Signed)
   03/18/20 0610  Vital Signs  Temp 98.3 F (36.8 C)  Temp Source Oral  Pulse Rate 68  BP 130/70  BP Location Right Arm  BP Method Automatic  Patient Position (if appropriate) Sitting  D: Patient Presents with depressed mood and affect Patient was calm and cooperative during med pass and took his medicine without incident.  Patient was out in open areas  Patient denies suicidal thoughts and self harming thoughts.A:  Patient took scheduled medicine.  Support and encouragement provided Routine safety checks conducted every 15 minutes. Patient  Informed to notify staff with any concerns.  Safety maintained.R:  No adverse drug reactions noted.  Patient contracts for safety.  Patient compliant with medication and treatment plan. Patient cooperative and calm. Patient interacts well with others on the unit.  Safety maintained.

## 2020-03-18 NOTE — Progress Notes (Signed)
[facility] MD Progress Note  03/18/2020 9:24 AM Alex Kline  MRN:  [id number] Subjective:   Patient seen this morning, resting comfortably in bed. Patient said he slept well last night and took all medications. He had a flat affect with the team. He denied any active suicidal or homicidal ideation. He noted that he just felt bad about things with his family. He said his mood goes up and down. On questioning, a down mood is when patient is laying in bed and is thinking too much about things that happened with his family. An up mood for him is when he is able to get out of bed, eat and communicate with others, including his father.He denies any auditory or visual hallucinations.   Principal Problem: MDD SI Diagnosis: Active Problems:   Schizophrenia ([place])   Suicidal ideation   MDD (major depressive disorder), severe ([place])  Total Time spent with patient: 20 minutes  Past Psychiatric History: Last admitted in January  Past Medical History:  Past Medical History:  Diagnosis Date  . Bipolar disorder ([place])   . Schizophrenia ([place])    History reviewed. No pertinent surgical history. Family History: History reviewed. No pertinent family history. Family Psychiatric  History:  Social History:  Social History   Substance and Sexual Activity  Alcohol Use Yes   Comment: occ     Social History   Substance and Sexual Activity  Drug Use Yes  . Types: Marijuana    Social History   Socioeconomic History  . Marital status: Single    Spouse name: Not on file  . Number of children: Not on file  . Years of education: Not on file  . Highest education level: Not on file  Occupational History  . Not on file  Tobacco Use  . Smoking status: Current Every Day Smoker    Types: Cigarettes  . Smokeless tobacco: Never Used  Substance and Sexual Activity  . Alcohol use: Yes    Comment: occ  . Drug use: Yes    Types: Marijuana  . Sexual activity: Not on file  Other Topics Concern  . Not on file   Social History Narrative  . Not on file   Social Determinants of Health   Financial Resource Strain:   . Difficulty of Paying Living Expenses:   Food Insecurity:   . Worried About Charity fundraiser in the Last Year:   . Arboriculturist in the Last Year:   Transportation Needs:   . Film/video editor (Medical):   Marland Kitchen Lack of Transportation (Non-Medical):   Physical Activity:   . Days of Exercise per Week:   . Minutes of Exercise per Session:   Stress:   . Feeling of Stress :   Social Connections:   . Frequency of Communication with Friends and Family:   . Frequency of Social Gatherings with Friends and Family:   . Attends Religious Services:   . Active Member of Clubs or Organizations:   . Attends Archivist Meetings:   Marland Kitchen Marital Status:    Additional Social History:    Pain Medications: Denies abuse Prescriptions: Denies abuse Over the Counter: Denies abuse History of alcohol / drug use?: Yes Longest period of sobriety (when/how long): Unknown Name of Substance 1: Marijuana 1 - Age of First Use: Adolescent 1 - Amount (size/oz): 1 gram 1 - Frequency: Pt reports returning to use 3 days ago 1 - Duration: Ongoing 1 - Last Use / Amount: 03/13/2020 Name of  Substance 2: Alcohol 2 - Age of First Use: Adolescent 2 - Amount (size/oz): 1 case of beer 2 - Frequency: Approximately 2x per week 2 - Duration: Ongoing 2 - Last Use / Amount: 03/11/2020                Sleep: Good  Appetite:  Fair  Current Medications: Current Facility-Administered Medications  Medication Dose Route Frequency Provider Last Rate Last Admin  . acetaminophen (TYLENOL) tablet 650 mg  650 mg Oral Q6H PRN Gillermo Murdoch, NP      . alum & mag hydroxide-simeth (MAALOX/MYLANTA) 200-200-20 MG/5ML suspension 30 mL  30 mL Oral Q4H PRN Gillermo Murdoch, NP      . benztropine (COGENTIN) tablet 1 mg  1 mg Oral TID Antonieta Pert, MD   1 mg at 03/18/20 9417  . divalproex  (DEPAKOTE) DR tablet 250 mg  250 mg Oral TID Gillermo Murdoch, NP   250 mg at 03/18/20 0839  . FLUoxetine (PROZAC) capsule 20 mg  20 mg Oral Daily Malvin Johns, MD   20 mg at 03/18/20 0839  . folic acid (FOLVITE) tablet 1 mg  1 mg Oral Daily Antonieta Pert, MD   1 mg at 03/18/20 0839  . LORazepam (ATIVAN) tablet 1 mg  1 mg Oral Q6H PRN Antonieta Pert, MD      . OLANZapine zydis [facility]) disintegrating tablet 10 mg  10 mg Oral Q8H PRN Antonieta Pert, MD       And  . LORazepam (ATIVAN) tablet 1 mg  1 mg Oral PRN Antonieta Pert, MD       And  . ziprasidone (GEODON) injection 20 mg  20 mg Intramuscular PRN Antonieta Pert, MD      . magnesium hydroxide (MILK OF MAGNESIA) suspension 30 mL  30 mL Oral Daily PRN Gillermo Murdoch, NP      . omega-3 acid ethyl esters (LOVAZA) capsule 1 g  1 g Oral BID Antonieta Pert, MD   1 g at 03/18/20 4081  . thiamine tablet 100 mg  100 mg Oral Daily Antonieta Pert, MD   100 mg at 03/18/20 0839  . traZODone (DESYREL) tablet 50 mg  50 mg Oral QHS PRN Antonieta Pert, MD   50 mg at 03/17/20 2047    Lab Results:  Results for orders placed or performed during the hospital encounter of 03/16/20 (from the past 48 hour(s))  CBC with Differential/Platelet     Status: None   Collection Time: 03/16/20  6:14 PM  Result Value Ref Range   WBC 5.1 4.0 - 10.5 K/uL   RBC 5.19 4.22 - 5.81 MIL/uL   Hemoglobin 13.9 13.0 - 17.0 g/dL   HCT 44.8 18.5 - 63.1 %   MCV 86.1 80.0 - 100.0 fL   MCH 26.8 26.0 - 34.0 pg   MCHC 31.1 30.0 - 36.0 g/dL   RDW 49.7 02.6 - 37.8 %   Platelets 202 150 - 400 K/uL   nRBC 0.0 0.0 - 0.2 %   Neutrophils Relative % 38 %   Neutro Abs 1.9 1.7 - 7.7 K/uL   Lymphocytes Relative 51 %   Lymphs Abs 2.6 0.7 - 4.0 K/uL   Monocytes Relative 9 %   Monocytes Absolute 0.5 0.1 - 1.0 K/uL   Eosinophils Relative 2 %   Eosinophils Absolute 0.1 0.0 - 0.5 K/uL   Basophils Relative 0 %   Basophils Absolute 0.0 0.0 - 0.1 K/uL  Immature Granulocytes 0 %   Abs Immature Granulocytes 0.02 0.00 - 0.07 K/uL    Comment: Performed at [facility], [address], [place], [place] [address]  Comprehensive metabolic panel     Status: Abnormal   Collection Time: 03/16/20  6:14 PM  Result Value Ref Range   Sodium 139 135 - 145 mmol/L   Potassium 3.5 3.5 - 5.1 mmol/L   Chloride 104 98 - 111 mmol/L   CO2 26 22 - 32 mmol/L   Glucose, Bld 91 70 - 99 mg/dL    Comment: Glucose reference range applies only to samples taken after fasting for at least 8 hours.   BUN 11 6 - 20 mg/dL   Creatinine, Ser 9.76 0.61 - 1.24 mg/dL   Calcium 9.0 8.9 - 73.4 mg/dL   Total Protein 7.0 6.5 - 8.1 g/dL   Albumin 4.3 3.5 - 5.0 g/dL   AST 13 (L) 15 - 41 U/L   ALT 11 0 - 44 U/L   Alkaline Phosphatase 69 38 - 126 U/L   Total Bilirubin 0.7 0.3 - 1.2 mg/dL   GFR calc non Af Amer >60 >60 mL/min   GFR calc Af Amer >60 >60 mL/min   Anion gap 9 5 - 15    Comment: Performed at [facility], [address], [place], [place] [address]  TSH     Status: None   Collection Time: 03/16/20  6:14 PM  Result Value Ref Range   TSH 1.589 0.350 - 4.500 uIU/mL    Comment: Performed by a 3rd Generation assay with a functional sensitivity of <=0.01 uIU/mL. Performed at [facility], [address], [place], [place] [address]   Valproic acid level     Status: Abnormal   Collection Time: 03/16/20  6:14 PM  Result Value Ref Range   Valproic Acid Lvl 41 (L) 50.0 - 100.0 ug/mL    Comment: Performed at [facility], [address], [place], [place] [address]    Blood Alcohol level:  Lab Results  Component Value Date   [facility] <10 12/26/2019   ETH <5 07/13/2015    Metabolic Disorder Labs: Lab Results  Component Value Date   HGBA1C 5.5 12/26/2019   MPG 111.15 12/26/2019   Lab Results  Component Value Date   PROLACTIN 24.8 (H) 12/26/2019   Lab Results  Component Value Date   CHOL  134 12/26/2019   TRIG 46 12/26/2019   HDL 43 12/26/2019   CHOLHDL 3.1 12/26/2019   VLDL 9 12/26/2019   LDLCALC 82 12/26/2019    Physical Findings: AIMS: Facial and Oral Movements Muscles of Facial Expression: None, normal Lips and Perioral Area: None, normal Jaw: None, normal Tongue: None, normal,Extremity Movements Upper (arms, wrists, hands, fingers): Mild Lower (legs, knees, ankles, toes): None, normal, Trunk Movements Neck, shoulders, hips: None, normal, Overall Severity Severity of abnormal movements (highest score from questions above): Moderate Incapacitation due to abnormal movements: Minimal Patient's awareness of abnormal movements (rate only patient's report): Aware, mild distress, Dental Status Current problems with teeth and/or dentures?: No Does patient usually wear dentures?: No  CIWA:  CIWA-Ar Total: 3 COWS:  COWS Total Score: 7  Musculoskeletal: Strength & Muscle Tone: within normal limits Gait & Station: normal Patient leans: N/A  Psychiatric Specialty Exam: Physical Exam  Review of Systems  Blood pressure 125/77, pulse 90, temperature 98.3 F (36.8 C), temperature source Oral, resp. rate 18, height 5\' 9"  (1.753 m), weight 72.6  kg, SpO2 99 %.Body mass index is 23.63 kg/m.  General Appearance: Casual  Eye Contact:  Fair  Speech:  Slow  Volume:  Decreased  Mood:  Dysphoric  Affect:  Blunt  Thought Process:  Goal Directed  Orientation:  Not assessed  Thought Content:  Logical  Suicidal Thoughts:  No at present / variable response earlier  Homicidal Thoughts:  No  Memory:  Not assessed  Judgement:  Fair  Insight:  Fair  Psychomotor Activity:  Normal  Concentration:  Not assessed  Recall:  Not assessed  Fund of Knowledge:  Not assessed  Language:  Fair  Akathisia:  Negative  Handed:  Right  AIMS (if indicated):     Assets:  Leisure Time Physical Health  ADL's:  Intact  Cognition:  WNL  Sleep:  Number of Hours: 4     Treatment Plan  Summary: Daily contact with patient to assess and evaluate symptoms and progress in treatment and Medication management  Plan to continue current reality based therapy and medications. Patient was started on depakote and Prozac on 4/13. Continue to monitor for safety no change in precautions probable discharge 1-2 days.   Arvil Chaco, Medical Student 03/18/2020, 9:24 AM

## 2020-03-18 NOTE — Progress Notes (Signed)
Patient spoke in group about watching television with his peers and having a good talk with his grandma. His goal for tomorrow is to be a better person to others.

## 2020-03-18 NOTE — Progress Notes (Signed)
Recreation Therapy Notes  Date: 4.13.21 Time: 1000 Location: 500 Hall Dayroom  Group Topic: Anxiety  Goal Area(s) Addresses:  Patient will identify triggers to anxiety. Patient will identify physical symptoms to anxiety. Patient will identify coping strategies for anxiety that can be used post d/c.  Intervention: Worksheet, pencils  Activity: Introduction to Anxiety.  Patients were to identify three things that trigger their anxiety, three physical symptoms they experience from anxiety, three thoughts they have from anxiety and coping strategies they use for anxiety.  Education: Communication, Discharge Planning  Education Outcome: Acknowledges understanding/In group clarification offered/Needs additional education.   Clinical Observations/Feedback: Pt did not attend group session.    Caroll Rancher, LRT/CTRS         Caroll Rancher A 03/18/2020 11:31 AM

## 2020-03-18 NOTE — [facility] Suicide Risk Assessment (Signed)
[facility] INPATIENT:  Family/Significant Other Suicide Prevention Education  Suicide Prevention Education:  Contact Attempts: father, Heinz Eckert [phone] has been identified by the patient as the family member/significant other with whom the patient will be residing, and identified as the person(s) who will aid the patient in the event of a mental health crisis.  With written consent from the patient, two attempts were made to provide suicide prevention education, prior to and/or following the patient's discharge.  We were unsuccessful in providing suicide prevention education.  A suicide education pamphlet was given to the patient to share with family/significant other.  Date and time of first attempt:03/18/2020 at 2:05pm. The voicemail box was full, CSW unable to leave a message.  Date and time of second attempt: Needs attempt  Darreld Mclean 03/18/2020, 2:07 PM

## 2020-03-19 MED ORDER — BENZTROPINE MESYLATE 1 MG PO TABS
1.0000 mg | ORAL_TABLET | Freq: Two times a day (BID) | ORAL | Status: DC
  Filled 2020-03-19 (×2): qty 14

## 2020-03-19 MED ORDER — BENZTROPINE MESYLATE 1 MG PO TABS: 1.0000 mg | ORAL_TABLET | Freq: Two times a day (BID) | ORAL | 2 refills | Status: DC

## 2020-03-19 MED ORDER — HALOPERIDOL 5 MG PO TABS
15.0000 mg | ORAL_TABLET | Freq: Every day | ORAL | Status: DC
  Filled 2020-03-19: qty 30

## 2020-03-19 MED ORDER — FLUOXETINE HCL 20 MG PO CAPS: 20.0000 mg | ORAL_CAPSULE | Freq: Every day | ORAL | 1 refills | Status: DC

## 2020-03-19 MED ORDER — OMEGA-3-ACID ETHYL ESTERS 1 G PO CAPS: 1.0000 g | ORAL_CAPSULE | Freq: Two times a day (BID) | ORAL | 2 refills | Status: DC

## 2020-03-19 MED ORDER — HALOPERIDOL 5 MG PO TABS: 15.0000 mg | ORAL_TABLET | Freq: Every day | ORAL | 2 refills | Status: DC

## 2020-03-19 NOTE — Progress Notes (Signed)
   03/19/20 0100  Psych Admission Type (Psych Patients Only)  Admission Status Voluntary  Psychosocial Assessment  Patient Complaints Anxiety  Eye Contact Fair  Facial Expression Pensive  Affect Anxious;Appropriate to circumstance;Preoccupied  Speech Logical/coherent;Soft  Interaction Avoidant;Forwards little;Guarded;Minimal  Motor Activity Tremors  Appearance/Hygiene Unremarkable  Behavior Characteristics Cooperative  Mood Suspicious  Thought Process  Coherency WDL  Content WDL  Delusions None reported or observed  Perception WDL  Hallucination None reported or observed  Judgment Poor  Confusion None  Danger to Self  Current suicidal ideation? Denies  Danger to Others  Danger to Others None reported or observed

## 2020-03-19 NOTE — [facility] Suicide Risk Assessment (Signed)
[facility] INPATIENT:  Family/Significant Other Suicide Prevention Education  Suicide Prevention Education:  Contact Attempts: father, Alex Kline [phone] has been identified by the patient as the family member/significant other with whom the patient will be residing, and identified as the person(s) who will aid the patient in the event of a mental health crisis.  With written consent from the patient, two attempts were made to provide suicide prevention education, prior to and/or following the patient's discharge.  We were unsuccessful in providing suicide prevention education.  A suicide education pamphlet was given to the patient to share with family/significant other.  Date and time of first attempt:03/18/2020 at 2:05pm. The voicemail box was full, CSW unable to leave a message.  Date and time of second attempt: 03/19/2020 at 9:00am.  SPE provided to patient.  Alex Kline 03/19/2020, 9:10 AM

## 2020-03-19 NOTE — Progress Notes (Signed)
  [facility] Adult Case Management Discharge Plan :  Will you be returning to the same living situation after discharge:  Yes,  home. At discharge, do you have transportation home?: Yes,  [facility] at 1:00pm. Do you have the ability to pay for your medications: No. Provided samples and referred to [facility].  Release of information consent forms completed and in the chart.  Patient to Follow up at: Follow-up Information    [facility] Follow up on 04/02/2020.   Why: You are scheduled for an appointment on 04/02/20 at 8:30 am.  This will be a virtual tele-health appointment.  Please have your discharge summary available. Contact information: [address] [place] [address] [phone]           Next level of care provider has access to [facility] Link:no  Safety Planning and Suicide Prevention discussed: Yes,  with patient, two attempts to reach father.  Has patient been referred to the Quitline?: Patient refused referral  Patient has been referred for addiction treatment: Yes  Darreld Mclean, LCSWA 03/19/2020, 9:25 AM

## 2020-03-19 NOTE — Progress Notes (Signed)
RN met with pt and reviewed pt's discharge instructions.  Pt verbalized understanding of discharge instructions and pt did not have any questions. RN returned pt's belongings to pt.  Prescriptions and samples were given to pt.  Pt denied SI/HI/AVH and voiced no concerns.  Pt was appreciative of the care pt received at [facility]. Patient discharged to the lobby where [facility] was waiting to take pt home.

## 2020-03-19 NOTE — [facility] Suicide Risk Assessment (Signed)
[facility] Discharge Suicide Risk Assessment   Principal Problem: MDD/schizophrenia Discharge Diagnoses: Active Problems:   Schizophrenia ([facility])   Suicidal ideation   MDD (major depressive disorder), severe ([facility])   Total Time spent with patient: 45 minutes  Musculoskeletal: Strength & Muscle Tone: within normal limits Gait & Station: normal Patient leans: N/A  Psychiatric Specialty Exam: Review of Systems  Blood pressure 119/69, pulse 85, temperature 98.2 F (36.8 C), temperature source Oral, resp. rate 18, height 5\' 9"  (1.753 m), weight 72.6 kg, SpO2 99 %.Body mass index is 23.63 kg/m.  General Appearance: Casual  Eye Contact::  Good  Speech:  Slow409  Volume:  Decreased  Mood:  Dysphoric  Affect:  Restricted  Thought Process:  Coherent and Goal Directed  Orientation:  Full (Time, Place, and Person)  Thought Content:  Rumination  Suicidal Thoughts:  No  Homicidal Thoughts:  No  Memory:  Immediate;   Fair Recent;   Fair Remote;   Fair  Judgement:  Fair  Insight:  Fair  Psychomotor Activity:  Normal  Concentration:  Fair  Recall:  [phone] of Knowledge:Fair  Language: Fair  Akathisia:  Negative  Handed:  Right  AIMS (if indicated):     Assets:  Communication Skills Desire for Improvement Leisure Time Physical Health Resilience  Sleep:  Number of Hours: 5  Cognition: WNL  ADL's:  Intact   Mental Status Per Nursing Assessment::   On Admission:  Suicidal ideation indicated by patient  Demographic Factors:  Unemployed  Loss Factors: Decrease in vocational status  Historical Factors: NA  Risk Reduction Factors:   Sense of responsibility to family and Religious beliefs about death  Continued Clinical Symptoms:  Previous Psychiatric Diagnoses and Treatments  Cognitive Features That Contribute To Risk:  Polarized thinking    Suicide Risk:  Minimal: No identifiable suicidal ideation.  Patients presenting with no risk factors but with morbid ruminations;  may be classified as minimal risk based on the severity of the depressive symptoms  Follow-up Information    [facility] Follow up on 04/02/2020.   Why: You are scheduled for an appointment on 04/02/20 at 8:30 am.  This will be a virtual tele-health appointment.  Please have your discharge summary available. Contact information: [address] [place] [phone]           Plan Of Care/Follow-up recommendations:  Activity:  full  Carma Dwiggins, MD 03/19/2020, 8:32 AM

## 2020-03-19 NOTE — Discharge Summary (Signed)
Physician Discharge Summary Note  Patient:  Alex Kline is an 29 y.o., male  MRN:  [id number]  DOB:  1990/12/20  Patient phone:  [phone] (home)   Patient address:   [address] [place] [address],   Total Time spent with patient: Greater than 30 minutes  Date of Admission:  03/16/2020  Date of Discharge: 03/19/20  Reason for Admission: Suicidal, and a plan to slit his wrist or hang himself triggered by argument with parents.  Principal Problem: Schizophrenia [facility])  Discharge Diagnoses: Principal Problem:   Schizophrenia (HCC) Active Problems:   Suicidal ideation   MDD (major depressive disorder), severe (HCC)  Past Psychiatric History: Schizophrenia. Cannabis use disorder.  Past Medical History:  Past Medical History:  Diagnosis Date  . Bipolar disorder (HCC)   . Schizophrenia (HCC)    History reviewed. No pertinent surgical history.  Family History: History reviewed. No pertinent family history.  Family Psychiatric  History: See H&P  Social History:  Social History   Substance and Sexual Activity  Alcohol Use Yes   Comment: occ     Social History   Substance and Sexual Activity  Drug Use Yes  . Types: Marijuana    Social History   Socioeconomic History  . Marital status: Single    Spouse name: Not on file  . Number of children: Not on file  . Years of education: Not on file  . Highest education level: Not on file  Occupational History  . Not on file  Tobacco Use  . Smoking status: Current Every Day Smoker    Types: Cigarettes  . Smokeless tobacco: Never Used  Substance and Sexual Activity  . Alcohol use: Yes    Comment: occ  . Drug use: Yes    Types: Marijuana  . Sexual activity: Not on file  Other Topics Concern  . Not on file  Social History Narrative  . Not on file   Social Determinants of Health   Financial Resource Strain:   . Difficulty of Paying Living Expenses:   Food Insecurity:   . Worried About  Programme researcher, broadcasting/film/video in the Last Year:   . Barista in the Last Year:   Transportation Needs:   . Freight forwarder (Medical):   Marland Kitchen Lack of Transportation (Non-Medical):   Physical Activity:   . Days of Exercise per Week:   . Minutes of Exercise per Session:   Stress:   . Feeling of Stress :   Social Connections:   . Frequency of Communication with Friends and Family:   . Frequency of Social Gatherings with Friends and Family:   . Attends Religious Services:   . Active Member of Clubs or Organizations:   . Attends Banker Meetings:   Marland Kitchen Marital Status:    Hospital Course: (Per Md's admission evaluation): Patient is a 29 year old male with a past psychiatric history significant for schizophrenia who presented as a walk-in patient to the behavioral health hospital on 03/16/2020 with suicidal ideation. The patient stated that he had been getting into disagreements with his mother and father, and "they put me out". He also stated that he was living with a friend and that was where he would return to. He stated that the issues with his parents made him suicidal, and a plan to slit his wrist or hang himself. He denied any previous self-harm in the past. His first psychiatric admission at our facility was in January 2021. At that time he had  auditory and visual hallucinations, had been sexually inappropriate, and required hospitalization. During that hospitalization he was started on Risperdal, but had been stopped secondary to hypotension. Zyprexa was initiated, but he remains psychotic. He was placed on haloperidol, Cogentin and Depakote. He improved with these medications. He stated that he received his last Haldol Decanoate injection of 150 mg on 03/06/2020. He stated he had been compliant with his Depakote. He denied any auditory or visual hallucinations. He does have a tremor, but on physical examination did not have any cogwheeling. He is currently on probation and  wearing an ankle bracelet. He is apparently on probation secondary to some weapon charge. He is apparently to be released on probation on June 2021. He admitted to the use of approximately 1 g of marijuana a day, and stated that he had been drinking alcohol at least 2 to 3 days a week. He denied any alcohol withdrawal symptoms in the past. No laboratories are currently available. He was admitted to the hospital for evaluation and stabilization.  This is the second psychiatric discharge summary for this 29 year old AA male with hx of prior hx of mental illness. He is currently known in this [facility] from his last admission & treatment for worsening psychosis. After mood stabilization on that last admission, he was discharged with a recommendation for an outpatient psychiatric services & medication management. He returned back to the [facility] this time around for evaluation & treatment for suicidal ideations with a plan to hang himself. He cited as his trigger an argument with his parents that resulted in him being put out of the home. He was admitted for mood stabilization treatments.  After evaluation of his presenting symptoms, Alex Kline was recommended for mood stabilization treatments. The medication regimen for his presenting symptoms were discussed & with his consent initiated. He received, stabilized & was discharged on the medications as listed below on his discharge medication lists. He was also enrolled & participated in the group counseling sessions being offered & held on this unit. He learned coping skills. He presented on this admission, no other pre-existing medical conditions that required treatment & monitoring. He tolerated his treatment regimen without any adverse effects or reactions reported.  During the course of his hospitalization, the 15-minute checks were adequate to ensure Alex Kline's safety.  Patient did not display any dangerous, violent or suicidal behavior on the unit. He interacted  with patients & staff appropriately, participated appropriately in the group sessions/therapies. His medications were addressed & adjusted to meet his needs. He was recommended for outpatient follow-up care & medication management upon discharge to assure his continuity of care.  At the time of discharge, patient is not reporting any acute suicidal/homicidal ideations. He feels more confident about his self-care & in managing his symptoms. He currently denies any new issues or concerns. Education and supportive counseling provided throughout her hospital stay & upon discharge.  Today upon his discharge evaluation with the attending psychiatrist, Alex Kline shares he is doing well. He denies any other specific concerns. He is sleeping well. His appetite is good. He denies other physical complaints. He denies AH/VH. He feels that his medications have been helpful & is in agreement to continue his current treatment regimen as recommended. He was able to engage in safety planning including plan to return to [facility] or contact emergency services if he feels unable to maintain his own safety or the safety of others. Pt had no further questions, comments, or concerns. He left [facility] with all  personal belongings in no apparent distress. Transportation per the [employer]..  Physical Findings: AIMS: Facial and Oral Movements Muscles of Facial Expression: None, normal Lips and Perioral Area: None, normal Jaw: None, normal Tongue: None, normal,Extremity Movements Upper (arms, wrists, hands, fingers): Mild Lower (legs, knees, ankles, toes): None, normal, Trunk Movements Neck, shoulders, hips: None, normal, Overall Severity Severity of abnormal movements (highest score from questions above): Moderate Incapacitation due to abnormal movements: Minimal Patient's awareness of abnormal movements (rate only patient's report): Aware, mild distress, Dental Status Current problems with teeth and/or dentures?: No Does  patient usually wear dentures?: No  CIWA:  CIWA-Ar Total: 3 COWS:  COWS Total Score: 7  Musculoskeletal: Strength & Muscle Tone: within normal limits Gait & Station: normal Patient leans: N/A  Psychiatric Specialty Exam: Physical Exam  Nursing note and vitals reviewed. Constitutional: He is oriented to person, place, and time. He appears well-developed and well-nourished.  Eyes: Pupils are equal, round, and reactive to light.  Cardiovascular: Normal rate.  Respiratory: Effort normal.  Genitourinary:    Genitourinary Comments: Deferred   Musculoskeletal:        General: Normal range of motion.     Cervical back: Normal range of motion.  Neurological: He is alert and oriented to person, place, and time.  Skin: Skin is warm and dry.    Review of Systems  Constitutional: Negative.  Negative for chills, diaphoresis and fever.  HENT: Negative for congestion, rhinorrhea, sneezing and sore throat.   Eyes: Negative for discharge.  Respiratory: Negative for cough, chest tightness, shortness of breath and wheezing.   Cardiovascular: Negative for chest pain and palpitations.  Gastrointestinal: Negative for diarrhea, nausea and vomiting.  Endocrine: Negative for cold intolerance.  Genitourinary: Negative for difficulty urinating.  Musculoskeletal: Negative.   Skin: Negative.   Allergic/Immunologic: Negative for environmental allergies and food allergies.       NKDA  Psychiatric/Behavioral: Positive for dysphoric mood (Stabilized with medication prior to discharge), hallucinations (Hx. of (Stabilized with medication prior to discharge)) and sleep disturbance (Stabilized with medication prior to discharge). Negative for agitation, behavioral problems, decreased concentration, self-injury and suicidal ideas. The patient is not nervous/anxious (Stable) and is not hyperactive.     Blood pressure 119/69, pulse 85, temperature 98.2 F (36.8 C), temperature source Oral, resp. rate 18, height 5\' 9"   (1.753 m), weight 72.6 kg, SpO2 99 %.Body mass index is 23.63 kg/m.  See MD's discharge SRA    Has this patient used any form of tobacco in the last 30 days? (Cigarettes, Smokeless Tobacco, Cigars, and/or Pipes): No  Blood Alcohol level:  Lab Results  Component Value Date   ETH <10 12/26/2019   ETH <5 17/61/6073   Metabolic Disorder Labs:  Lab Results  Component Value Date   HGBA1C 5.5 12/26/2019   MPG 111.15 12/26/2019   Lab Results  Component Value Date   PROLACTIN 24.8 (H) 12/26/2019   Lab Results  Component Value Date   CHOL 134 12/26/2019   TRIG 46 12/26/2019   HDL 43 12/26/2019   CHOLHDL 3.1 12/26/2019   VLDL 9 12/26/2019   [place] 82 12/26/2019   See Psychiatric Specialty Exam and Suicide Risk Assessment completed by Attending Physician prior to discharge.  Discharge destination:  Home  Is patient on multiple antipsychotic therapies at discharge:  No   Has Patient had three or more failed trials of antipsychotic monotherapy by history:  No  Recommended Plan for Multiple Antipsychotic Therapies: NA  Allergies as of 03/19/2020  No Known Allergies     Medication List    TAKE these medications     Indication  benztropine 1 MG tablet Commonly known as: COGENTIN Take 1 tablet (1 mg total) by mouth 2 (two) times daily.  Indication: Extrapyramidal Reaction caused by Medications   divalproex 250 MG DR tablet Commonly known as: DEPAKOTE Take 1 tablet (250 mg total) by mouth 3 (three) times daily.  Indication: Manic Phase of Manic-Depression   FLUoxetine 20 MG capsule Commonly known as: PROZAC Take 1 capsule (20 mg total) by mouth daily.  Indication: Depression   haloperidol 5 MG tablet Commonly known as: HALDOL Take 3 tablets (15 mg total) by mouth at bedtime.  Indication: Hypomanic Episode of Bipolar Disorder   haloperidol decanoate 100 MG/ML injection Commonly known as: HALDOL DECANOATE Inject 1.5 mLs (150 mg total) into the muscle every 30  (thirty) days. Due 2/26  Indication: Schizophrenia   omega-3 acid ethyl esters 1 g capsule Commonly known as: LOVAZA Take 1 capsule (1 g total) by mouth 2 (two) times daily.  Indication: High Amount of Triglycerides in the Blood      Follow-up Information    [facility] Follow up on 04/02/2020.   Why: You are scheduled for an appointment on 04/02/20 at 8:30 am.  This will be a virtual tele-health appointment.  Please have your discharge summary available. Contact information: [address] [place] [address] [phone]          Follow-up recommendations: Activity:  As tolerated Diet: As recommended by your primary care doctor. Keep all scheduled follow-up appointments as recommended.  Comments: Prescriptions given at discharge.  Patient agreeable to plan.  Given opportunity to ask questions.  Appears to feel comfortable with discharge denies any current suicidal or homicidal thought. Patient is also instructed prior to discharge to: Take all medications as prescribed by his/her mental healthcare provider. Report any adverse effects and or reactions from the medicines to his/her outpatient provider promptly. Patient has been instructed & cautioned: To not engage in alcohol and or illegal drug use while on prescription medicines. In the event of worsening symptoms, patient is instructed to call the crisis hotline, 911 and or go to the nearest ED for appropriate evaluation and treatment of symptoms. To follow-up with his/her primary care provider for your other medical issues, concerns and or health care needs.  Signed: Armandina Stammer, NP, PMHNP, FNP-BC 03/19/2020, 8:45 AM

## 2020-05-06 ENCOUNTER — Ambulatory Visit (HOSPITAL_COMMUNITY): Payer: Federal, State, Local not specified - Other | Admitting: Psychiatry

## 2020-05-06 ENCOUNTER — Ambulatory Visit (HOSPITAL_COMMUNITY): Payer: Federal, State, Local not specified - Other

## 2020-05-06 ENCOUNTER — Other Ambulatory Visit: Payer: Self-pay

## 2020-05-06 ENCOUNTER — Ambulatory Visit (HOSPITAL_COMMUNITY): Payer: Federal, State, Local not specified - Other | Admitting: Licensed Clinical Social Worker

## 2020-05-20 ENCOUNTER — Ambulatory Visit (HOSPITAL_COMMUNITY): Payer: Federal, State, Local not specified - Other | Admitting: Psychiatry

## 2020-05-20 ENCOUNTER — Ambulatory Visit (HOSPITAL_COMMUNITY): Payer: Federal, State, Local not specified - Other | Admitting: Licensed Clinical Social Worker

## 2020-05-20 ENCOUNTER — Ambulatory Visit (HOSPITAL_COMMUNITY): Payer: Federal, State, Local not specified - Other

## 2020-06-05 ENCOUNTER — Telehealth (HOSPITAL_COMMUNITY): Payer: Federal, State, Local not specified - Other | Admitting: Psychiatric/Mental Health

## 2020-06-05 ENCOUNTER — Other Ambulatory Visit: Payer: Self-pay

## 2020-06-05 ENCOUNTER — Ambulatory Visit (HOSPITAL_COMMUNITY): Payer: Federal, State, Local not specified - Other | Admitting: Licensed Clinical Social Worker

## 2020-06-06 ENCOUNTER — Ambulatory Visit (HOSPITAL_COMMUNITY): Payer: Federal, State, Local not specified - Other

## 2020-06-24 ENCOUNTER — Ambulatory Visit (HOSPITAL_COMMUNITY): Payer: Federal, State, Local not specified - Other | Admitting: Licensed Clinical Social Worker

## 2020-08-11 ENCOUNTER — Ambulatory Visit (HOSPITAL_COMMUNITY)
Admission: EM | Admit: 2020-08-11 | Discharge: 2020-08-11 | Disposition: A | Discharge status: Home / Self Care | Payer: Medicaid Other

## 2020-08-11 ENCOUNTER — Other Ambulatory Visit: Payer: Self-pay

## 2020-08-11 NOTE — ED Notes (Signed)
Attempted to call pt twice from lobby with no answer.

## 2020-09-28 ENCOUNTER — Other Ambulatory Visit: Payer: Self-pay

## 2020-09-28 ENCOUNTER — Emergency Department (HOSPITAL_COMMUNITY)
Admission: EM | Admit: 2020-09-28 | Discharge: 2020-09-28 | Disposition: A | Discharge status: Home / Self Care | Payer: Medicaid Other | Attending: Emergency Medicine | Admitting: Emergency Medicine

## 2020-09-28 ENCOUNTER — Encounter (HOSPITAL_COMMUNITY): Payer: Self-pay | Admitting: Emergency Medicine

## 2020-09-28 DIAGNOSIS — F17210 Nicotine dependence, cigarettes, uncomplicated: Secondary | ICD-10-CM | POA: Insufficient documentation

## 2020-09-28 DIAGNOSIS — L03031 Cellulitis of right toe: Secondary | ICD-10-CM | POA: Insufficient documentation

## 2020-09-28 MED ORDER — LIDOCAINE HCL 2 % IJ SOLN
5.0000 mL | Freq: Once | INTRAMUSCULAR | Status: AC
  Administered 2020-09-28: 100 mg via INTRADERMAL
  Filled 2020-09-28: qty 20

## 2020-09-28 NOTE — ED Triage Notes (Signed)
Pt c/o R great toe pain, ? Ingrown toenail x 1-2 days.

## 2020-09-28 NOTE — ED Provider Notes (Signed)
[facility] EMERGENCY DEPARTMENT Provider Note  CSN: [id number] Arrival date & time: 09/28/20 0201  Chief Complaint(s) Toe Pain  HPI Alex Kline is a 29 y.o. male   The history is provided by the patient.  Toe Pain This is a new problem. The current episode started 2 days ago. The problem occurs constantly. The problem has been gradually worsening. Pertinent negatives include no chest pain, no abdominal pain, no headaches and no shortness of breath. The symptoms are aggravated by walking. Nothing relieves the symptoms. He has tried nothing for the symptoms. The treatment provided no relief.    Past Medical History Past Medical History:  Diagnosis Date  . Bipolar disorder (HCC)   . Schizophrenia [facility])    Patient Active Problem List   Diagnosis Date Noted  . Suicidal ideation 03/16/2020  . MDD (major depressive disorder), severe (HCC) 03/16/2020  . Schizophrenia (HCC) 12/26/2019   Home Medication(s) Prior to Admission medications   Medication Sig Start Date End Date Taking? Authorizing Provider  benztropine (COGENTIN) 1 MG tablet Take 1 tablet (1 mg total) by mouth 2 (two) times daily. 03/19/20   Malvin Johns, MD  divalproex (DEPAKOTE) 250 MG DR tablet Take 1 tablet (250 mg total) by mouth 3 (three) times daily. 02/23/20   Elvina Sidle, MD  FLUoxetine (PROZAC) 20 MG capsule Take 1 capsule (20 mg total) by mouth daily. 03/19/20   Malvin Johns, MD  haloperidol (HALDOL) 5 MG tablet Take 3 tablets (15 mg total) by mouth at bedtime. 03/19/20   Malvin Johns, MD  haloperidol decanoate (HALDOL DECANOATE) 100 MG/ML injection Inject 1.5 mLs (150 mg total) into the muscle every 30 (thirty) days. Due 2/26 02/23/20   Elvina Sidle, MD  omega-3 acid ethyl esters (LOVAZA) 1 g capsule Take 1 capsule (1 g total) by mouth 2 (two) times daily. 03/19/20   Malvin Johns, MD                                                                                                                                     Past Surgical History History reviewed. No pertinent surgical history. Family History No family history on file.  Social History Social History   Tobacco Use  . Smoking status: Current Every Day Smoker    Types: Cigarettes  . Smokeless tobacco: Never Used  Substance Use Topics  . Alcohol use: Yes    Comment: occ  . Drug use: Yes    Types: Marijuana   Allergies Patient has no known allergies.  Review of Systems Review of Systems  Respiratory: Negative for shortness of breath.   Cardiovascular: Negative for chest pain.  Gastrointestinal: Negative for abdominal pain.  Neurological: Negative for headaches.   All other systems are reviewed and are negative for acute change except as noted in the HPI   Physical Exam Vital Signs  I have reviewed the triage vital signs BP 127/83 (BP Location:  Left Arm)   Pulse 63   Temp 97.8 F (36.6 C) (Oral)   Resp 17   Ht 5\' 9"  (1.753 m)   Wt 79.4 kg   SpO2 98%   BMI 25.84 kg/m   Physical Exam Vitals reviewed.  Constitutional:      General: He is not in acute distress.    Appearance: He is well-developed. He is not diaphoretic.  HENT:     Head: Normocephalic and atraumatic.     Jaw: No trismus.     Right Ear: External ear normal.     Left Ear: External ear normal.     Nose: Nose normal.  Eyes:     General: No scleral icterus.    Conjunctiva/sclera: Conjunctivae normal.  Neck:     Trachea: Phonation normal.  Cardiovascular:     Rate and Rhythm: Normal rate and regular rhythm.  Pulmonary:     Effort: Pulmonary effort is normal. No respiratory distress.     Breath sounds: No stridor.  Abdominal:     General: There is no distension.  Musculoskeletal:        General: Normal range of motion.     Cervical back: Normal range of motion.       Feet:  Neurological:     Mental Status: He is alert and oriented to person, place, and time.  Psychiatric:        Behavior: Behavior normal.     ED  Results and Treatments Labs (all labs ordered are listed, but only abnormal results are displayed) Labs Reviewed - No data to display                                                                                                                       EKG  EKG Interpretation  Date/Time:    Ventricular Rate:    PR Interval:    QRS Duration:   QT Interval:    QTC Calculation:   R Axis:     Text Interpretation:        Radiology No results found.  Pertinent labs & imaging results that were available during my care of the patient were reviewed by me and considered in my medical decision making (see chart for details).  Medications Ordered in ED Medications  lidocaine (XYLOCAINE) 2 % (with pres) injection 100 mg (100 mg Intradermal Given by Other 09/28/20 0328)  Procedures Drain paronychia  Date/Time: 09/28/2020 4:41 AM Performed by: Nira Conn, MD Authorized by: Nira Conn, MD  Local anesthesia used: yes Anesthesia: digital block  Anesthesia: Local anesthesia used: yes Local Anesthetic: lidocaine 2% without epinephrine  Sedation: Patient sedated: no  Patient tolerance: patient tolerated the procedure well with no immediate complications     (including critical care time)  Medical Decision Making / ED Course I have reviewed the nursing notes for this encounter and the patient's prior records (if available in EHR or on provided paperwork).   Sloan Takagi was evaluated in Emergency Department on 09/28/2020 for the symptoms described in the history of present illness. He was evaluated in the context of the global COVID-19 pandemic, which necessitated consideration that the patient might be at risk for infection with the SARS-CoV-2 virus that causes COVID-19. Institutional protocols and algorithms that pertain to the  evaluation of patients at risk for COVID-19 are in a state of rapid change based on information released by regulatory bodies including the CDC and federal and state organizations. These policies and algorithms were followed during the patient's care in the ED.  Right toe paronychia drained.      Final Clinical Impression(s) / ED Diagnoses Final diagnoses:  Paronychia of fourth toe of right foot    The patient appears reasonably screened and/or stabilized for discharge and I doubt any other medical condition or other [facility] requiring further screening, evaluation, or treatment in the ED at this time prior to discharge. Safe for discharge with strict return precautions.  Disposition: Discharge  Condition: Good  I have discussed the results, Dx and Tx plan with the patient/family who expressed understanding and agree(s) with the plan. Discharge instructions discussed at length. The patient/family was given strict return precautions who verbalized understanding of the instructions. No further questions at time of discharge.    ED Discharge Orders    None       Follow Up: Primary care provider  Schedule an appointment as soon as possible for a visit  As needed     This chart was dictated using voice recognition software.  Despite best efforts to proofread,  errors can occur which can change the documentation meaning.   Nira Conn, MD 09/28/20 [phone]

## 2021-05-05 ENCOUNTER — Other Ambulatory Visit: Payer: Self-pay

## 2021-05-05 ENCOUNTER — Ambulatory Visit: Payer: Medicaid Other | Admitting: *Deleted

## 2021-05-05 DIAGNOSIS — R0989 Other specified symptoms and signs involving the circulatory and respiratory systems: Secondary | ICD-10-CM

## 2021-05-05 NOTE — Progress Notes (Signed)
Patient reports experiencing URI symptoms when he returns to his home. Patient repots living alone. Patient has not taken medication today Patient has eaten today Patient denies pain at this time. Patient LWBS once C-19 results were complete.

## 2021-05-06 LAB — POC COVID19 BINAXNOW: SARS Coronavirus 2 Ag: NEGATIVE

## 2021-05-06 NOTE — Progress Notes (Signed)
Patient left while waiting for COVID testing to be completed.  Rapid COVID test negative.  Patient not seen by provider  Roney Jaffe, PA-C Physician Assistant [facility] [email or web address]

## 2021-05-07 ENCOUNTER — Other Ambulatory Visit: Payer: Self-pay

## 2021-05-07 ENCOUNTER — Ambulatory Visit (HOSPITAL_COMMUNITY)
Admission: EM | Admit: 2021-05-07 | Discharge: 2021-05-08 | Disposition: A | Discharge status: Other Acute Inpatient Hospital | Payer: No Payment, Other | Attending: Nurse Practitioner | Admitting: Nurse Practitioner

## 2021-05-07 DIAGNOSIS — F1220 Cannabis dependence, uncomplicated: Secondary | ICD-10-CM | POA: Insufficient documentation

## 2021-05-07 DIAGNOSIS — F17210 Nicotine dependence, cigarettes, uncomplicated: Secondary | ICD-10-CM | POA: Insufficient documentation

## 2021-05-07 DIAGNOSIS — F319 Bipolar disorder, unspecified: Secondary | ICD-10-CM | POA: Insufficient documentation

## 2021-05-07 DIAGNOSIS — Z5900 Homelessness unspecified: Secondary | ICD-10-CM | POA: Insufficient documentation

## 2021-05-07 DIAGNOSIS — F209 Schizophrenia, unspecified: Secondary | ICD-10-CM | POA: Insufficient documentation

## 2021-05-07 DIAGNOSIS — R45851 Suicidal ideations: Secondary | ICD-10-CM | POA: Insufficient documentation

## 2021-05-07 DIAGNOSIS — Z20822 Contact with and (suspected) exposure to covid-19: Secondary | ICD-10-CM | POA: Insufficient documentation

## 2021-05-07 DIAGNOSIS — F1490 Cocaine use, unspecified, uncomplicated: Secondary | ICD-10-CM | POA: Insufficient documentation

## 2021-05-07 DIAGNOSIS — Z9114 Patient's other noncompliance with medication regimen: Secondary | ICD-10-CM | POA: Insufficient documentation

## 2021-05-07 DIAGNOSIS — R45850 Homicidal ideations: Secondary | ICD-10-CM | POA: Insufficient documentation

## 2021-05-07 LAB — CBC WITH DIFFERENTIAL/PLATELET
Abs Immature Granulocytes: 0.02 10*3/uL (ref 0.00–0.07)
Basophils Absolute: 0 10*3/uL (ref 0.0–0.1)
Basophils Relative: 1 %
Eosinophils Absolute: 0.1 10*3/uL (ref 0.0–0.5)
Eosinophils Relative: 1 %
HCT: 42.5 % (ref 39.0–52.0)
Hemoglobin: 13.7 g/dL (ref 13.0–17.0)
Immature Granulocytes: 0 %
Lymphocytes Relative: 42 %
Lymphs Abs: 2.7 10*3/uL (ref 0.7–4.0)
MCH: 27.6 pg (ref 26.0–34.0)
MCHC: 32.2 g/dL (ref 30.0–36.0)
MCV: 85.5 fL (ref 80.0–100.0)
Monocytes Absolute: 0.5 10*3/uL (ref 0.1–1.0)
Monocytes Relative: 7 %
Neutro Abs: 3.1 10*3/uL (ref 1.7–7.7)
Neutrophils Relative %: 49 %
Platelets: 248 10*3/uL (ref 150–400)
RBC: 4.97 MIL/uL (ref 4.22–5.81)
RDW: 13 % (ref 11.5–15.5)
WBC: 6.4 10*3/uL (ref 4.0–10.5)
nRBC: 0 % (ref 0.0–0.2)

## 2021-05-07 LAB — COMPREHENSIVE METABOLIC PANEL
ALT: 12 U/L (ref 0–44)
AST: 15 U/L (ref 15–41)
Albumin: 4 g/dL (ref 3.5–5.0)
Alkaline Phosphatase: 73 U/L (ref 38–126)
Anion gap: 8 (ref 5–15)
BUN: <5 mg/dL — ABNORMAL LOW (ref 6–20)
CO2: 27 mmol/L (ref 22–32)
Calcium: 8.7 mg/dL — ABNORMAL LOW (ref 8.9–10.3)
Chloride: 103 mmol/L (ref 98–111)
Creatinine, Ser: 0.91 mg/dL (ref 0.61–1.24)
GFR, Estimated: >60 mL/min (ref >60)
Glucose, Bld: 73 mg/dL (ref 70–99)
Potassium: 3.4 mmol/L — ABNORMAL LOW (ref 3.5–5.1)
Sodium: 138 mmol/L (ref 135–145)
Total Bilirubin: 0.8 mg/dL (ref 0.3–1.2)
Total Protein: 5.8 g/dL — ABNORMAL LOW (ref 6.5–8.1)

## 2021-05-07 LAB — POCT URINE DRUG SCREEN - MANUAL ENTRY (I-SCREEN)
POC Amphetamine UR: NOT DETECTED
POC Buprenorphine (BUP): NOT DETECTED
POC Cocaine UR: NOT DETECTED
POC Marijuana UR: POSITIVE — AB
POC Methadone UR: NOT DETECTED
POC Methamphetamine UR: NOT DETECTED
POC Morphine: NOT DETECTED
POC Oxazepam (BZO): NOT DETECTED
POC Oxycodone UR: NOT DETECTED
POC Secobarbital (BAR): NOT DETECTED

## 2021-05-07 LAB — LIPID PANEL
Cholesterol: 141 mg/dL (ref 0–200)
HDL: 31 mg/dL — ABNORMAL LOW (ref >40)
LDL Cholesterol: 83 mg/dL (ref 0–99)
Total CHOL/HDL Ratio: 4.5 RATIO
Triglycerides: 133 mg/dL (ref <150)
VLDL: 27 mg/dL (ref 0–40)

## 2021-05-07 LAB — ETHANOL: Alcohol, Ethyl (B): <10 mg/dL (ref <10)

## 2021-05-07 LAB — POC SARS CORONAVIRUS 2 AG: SARSCOV2ONAVIRUS 2 AG: NEGATIVE

## 2021-05-07 LAB — RESP PANEL BY RT-PCR (FLU A&B, COVID) ARPGX2
Influenza A by PCR: NEGATIVE
Influenza B by PCR: NEGATIVE
SARS Coronavirus 2 by RT PCR: NEGATIVE

## 2021-05-07 LAB — POC SARS CORONAVIRUS 2 AG -  ED: SARS Coronavirus 2 Ag: NEGATIVE

## 2021-05-07 MED ORDER — LOPERAMIDE HCL 2 MG PO CAPS: 2.0000 mg | ORAL_CAPSULE | ORAL | Status: DC | PRN

## 2021-05-07 MED ORDER — MAGNESIUM HYDROXIDE 400 MG/5ML PO SUSP: 30.0000 mL | Freq: Every day | ORAL | Status: DC | PRN

## 2021-05-07 MED ORDER — ALUM & MAG HYDROXIDE-SIMETH 200-200-20 MG/5ML PO SUSP: 30.0000 mL | ORAL | Status: DC | PRN

## 2021-05-07 MED ORDER — HALOPERIDOL 5 MG PO TABS
15.0000 mg | ORAL_TABLET | Freq: Every day | ORAL | Status: DC
  Administered 2021-05-07: 15 mg via ORAL
  Filled 2021-05-07: qty 3

## 2021-05-07 MED ORDER — ACETAMINOPHEN 325 MG PO TABS: 650.0000 mg | ORAL_TABLET | Freq: Four times a day (QID) | ORAL | Status: DC | PRN

## 2021-05-07 MED ORDER — FLUOXETINE HCL 20 MG PO CAPS
20.0000 mg | ORAL_CAPSULE | Freq: Every day | ORAL | Status: DC
Start: 2021-05-07 — End: 2021-05-08
  Administered 2021-05-07 – 2021-05-08 (×2): 20 mg via ORAL
  Filled 2021-05-07 (×2): qty 1

## 2021-05-07 MED ORDER — TRAZODONE HCL 50 MG PO TABS
50.0000 mg | ORAL_TABLET | Freq: Every evening | ORAL | Status: DC | PRN
  Administered 2021-05-07: 50 mg via ORAL
  Filled 2021-05-07: qty 1

## 2021-05-07 MED ORDER — ONDANSETRON 4 MG PO TBDP: 4.0000 mg | ORAL_TABLET | Freq: Four times a day (QID) | ORAL | Status: DC | PRN

## 2021-05-07 MED ORDER — HYDROXYZINE HCL 25 MG PO TABS: 25.0000 mg | ORAL_TABLET | Freq: Three times a day (TID) | ORAL | Status: DC | PRN

## 2021-05-07 MED ORDER — BENZTROPINE MESYLATE 1 MG PO TABS
1.0000 mg | ORAL_TABLET | Freq: Two times a day (BID) | ORAL | Status: DC
  Administered 2021-05-07 – 2021-05-08 (×2): 1 mg via ORAL
  Filled 2021-05-07 (×2): qty 1

## 2021-05-07 MED ORDER — LORAZEPAM 1 MG PO TABS
1.0000 mg | ORAL_TABLET | Freq: Four times a day (QID) | ORAL | Status: DC | PRN
  Administered 2021-05-07: 1 mg via ORAL
  Filled 2021-05-07: qty 1

## 2021-05-07 MED ORDER — DIVALPROEX SODIUM 250 MG PO DR TAB
250.0000 mg | DELAYED_RELEASE_TABLET | Freq: Two times a day (BID) | ORAL | Status: DC
  Administered 2021-05-07 – 2021-05-08 (×2): 250 mg via ORAL
  Filled 2021-05-07 (×2): qty 1

## 2021-05-07 MED ORDER — OMEGA-3-ACID ETHYL ESTERS 1 G PO CAPS
1.0000 g | ORAL_CAPSULE | Freq: Two times a day (BID) | ORAL | Status: DC
  Administered 2021-05-07 – 2021-05-08 (×2): 1 g via ORAL
  Filled 2021-05-07 (×2): qty 1
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
## 2021-05-07 NOTE — ED Notes (Signed)
Pt received food by staff

## 2021-05-07 NOTE — ED Notes (Signed)
Pt A&O x 4, presents with suicidal ideations, no plan noted.  Complaint of depression, tearfulness and worthlessness.  Pt calm & cooperative.  Denies HI, admits to auditory & visual hallucinations.  Skin search completed.  Monitoring for safety.
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
## 2021-05-08 ENCOUNTER — Inpatient Hospital Stay (HOSPITAL_COMMUNITY)
Admission: AD | Admit: 2021-05-08 | Discharge: 2021-05-17 | DRG: 885 | Disposition: A | Discharge status: Home / Self Care | Payer: No Typology Code available for payment source | Attending: Psychiatry | Admitting: Psychiatry

## 2021-05-08 ENCOUNTER — Encounter (HOSPITAL_COMMUNITY): Payer: Self-pay | Admitting: Behavioral Health

## 2021-05-08 DIAGNOSIS — G4700 Insomnia, unspecified: Secondary | ICD-10-CM | POA: Diagnosis present

## 2021-05-08 DIAGNOSIS — Z79899 Other long term (current) drug therapy: Secondary | ICD-10-CM

## 2021-05-08 DIAGNOSIS — F419 Anxiety disorder, unspecified: Secondary | ICD-10-CM | POA: Diagnosis present

## 2021-05-08 DIAGNOSIS — F1220 Cannabis dependence, uncomplicated: Secondary | ICD-10-CM | POA: Diagnosis present

## 2021-05-08 DIAGNOSIS — F200 Paranoid schizophrenia: Secondary | ICD-10-CM | POA: Diagnosis present

## 2021-05-08 DIAGNOSIS — Z20822 Contact with and (suspected) exposure to covid-19: Secondary | ICD-10-CM | POA: Diagnosis present

## 2021-05-08 DIAGNOSIS — F203 Undifferentiated schizophrenia: Secondary | ICD-10-CM | POA: Diagnosis not present

## 2021-05-08 DIAGNOSIS — F17210 Nicotine dependence, cigarettes, uncomplicated: Secondary | ICD-10-CM | POA: Diagnosis present

## 2021-05-08 DIAGNOSIS — F209 Schizophrenia, unspecified: Secondary | ICD-10-CM | POA: Diagnosis present

## 2021-05-08 DIAGNOSIS — R45851 Suicidal ideations: Secondary | ICD-10-CM | POA: Diagnosis present

## 2021-05-08 LAB — VALPROIC ACID LEVEL: Valproic Acid Lvl: <10 ug/mL — ABNORMAL LOW (ref 50.0–100.0)

## 2021-05-08 LAB — HEMOGLOBIN A1C
Hgb A1c MFr Bld: 5.5 % (ref 4.8–5.6)
Mean Plasma Glucose: 111 mg/dL

## 2021-05-08 LAB — TSH: TSH: 0.678 u[IU]/mL (ref 0.350–4.500)

## 2021-05-08 LAB — HIV ANTIBODY (ROUTINE TESTING W REFLEX): HIV Screen 4th Generation wRfx: NONREACTIVE

## 2021-05-08 MED ORDER — DIVALPROEX SODIUM 250 MG PO DR TAB
250.0000 mg | DELAYED_RELEASE_TABLET | Freq: Two times a day (BID) | ORAL | Status: DC
  Administered 2021-05-08: 250 mg via ORAL
  Filled 2021-05-08 (×3): qty 1

## 2021-05-08 MED ORDER — TRAZODONE HCL 50 MG PO TABS
50.0000 mg | ORAL_TABLET | Freq: Every evening | ORAL | Status: DC | PRN
  Administered 2021-05-08 – 2021-05-09 (×2): 50 mg via ORAL
  Filled 2021-05-08 (×2): qty 1

## 2021-05-08 MED ORDER — BENZTROPINE MESYLATE 1 MG PO TABS
1.0000 mg | ORAL_TABLET | Freq: Two times a day (BID) | ORAL | Status: DC
  Administered 2021-05-08 – 2021-05-16 (×16): 1 mg via ORAL
  Filled 2021-05-08 (×21): qty 1

## 2021-05-08 MED ORDER — ZIPRASIDONE MESYLATE 20 MG IM SOLR
20.0000 mg | Freq: Three times a day (TID) | INTRAMUSCULAR | Status: AC | PRN
  Administered 2021-05-09: 20 mg via INTRAMUSCULAR
  Filled 2021-05-08: qty 20

## 2021-05-08 MED ORDER — ZIPRASIDONE MESYLATE 20 MG IM SOLR
20.0000 mg | Freq: Once | INTRAMUSCULAR | Status: DC
  Filled 2021-05-08: qty 20

## 2021-05-08 MED ORDER — LORAZEPAM 1 MG PO TABS: 2.0000 mg | ORAL_TABLET | Freq: Four times a day (QID) | ORAL | Status: AC | PRN

## 2021-05-08 MED ORDER — HALOPERIDOL 5 MG PO TABS
15.0000 mg | ORAL_TABLET | Freq: Every day | ORAL | Status: DC
  Administered 2021-05-08 – 2021-05-09 (×2): 15 mg via ORAL
  Filled 2021-05-08 (×4): qty 3

## 2021-05-08 MED ORDER — DIVALPROEX SODIUM 250 MG PO DR TAB: 250.0000 mg | DELAYED_RELEASE_TABLET | Freq: Two times a day (BID) | ORAL | Status: DC

## 2021-05-08 MED ORDER — ACETAMINOPHEN 325 MG PO TABS: 650.0000 mg | ORAL_TABLET | Freq: Four times a day (QID) | ORAL | Status: DC | PRN

## 2021-05-08 MED ORDER — HALOPERIDOL 5 MG PO TABS
10.0000 mg | ORAL_TABLET | Freq: Every day | ORAL | Status: DC
  Administered 2021-05-09 – 2021-05-12 (×4): 10 mg via ORAL
  Filled 2021-05-08 (×6): qty 2

## 2021-05-08 MED ORDER — POTASSIUM CHLORIDE CRYS ER 20 MEQ PO TBCR
20.0000 meq | EXTENDED_RELEASE_TABLET | Freq: Once | ORAL | Status: AC
  Administered 2021-05-08: 20 meq via ORAL

## 2021-05-08 MED ORDER — ALUM & MAG HYDROXIDE-SIMETH 200-200-20 MG/5ML PO SUSP: 30.0000 mL | ORAL | Status: DC | PRN

## 2021-05-08 MED ORDER — HALOPERIDOL LACTATE 5 MG/ML IJ SOLN
10.0000 mg | Freq: Every day | INTRAMUSCULAR | Status: DC
  Filled 2021-05-08 (×6): qty 2

## 2021-05-08 MED ORDER — MAGNESIUM HYDROXIDE 400 MG/5ML PO SUSP: 30.0000 mL | Freq: Every day | ORAL | Status: DC | PRN

## 2021-05-08 MED ORDER — HALOPERIDOL LACTATE 5 MG/ML IJ SOLN
15.0000 mg | Freq: Every day | INTRAMUSCULAR | Status: DC
  Filled 2021-05-08 (×3): qty 3

## 2021-05-08 MED ORDER — HALOPERIDOL 5 MG PO TABS
15.0000 mg | ORAL_TABLET | Freq: Every day | ORAL | Status: DC
  Filled 2021-05-08: qty 3

## 2021-05-08 MED ORDER — FLUOXETINE HCL 20 MG PO CAPS
20.0000 mg | ORAL_CAPSULE | Freq: Every day | ORAL | Status: DC
  Filled 2021-05-08: qty 1

## 2021-05-08 MED ORDER — DIVALPROEX SODIUM 500 MG PO DR TAB
500.0000 mg | DELAYED_RELEASE_TABLET | Freq: Two times a day (BID) | ORAL | Status: DC
  Administered 2021-05-09 – 2021-05-10 (×3): 500 mg via ORAL
  Filled 2021-05-08 (×7): qty 1

## 2021-05-08 MED ORDER — HYDROXYZINE HCL 25 MG PO TABS
25.0000 mg | ORAL_TABLET | Freq: Three times a day (TID) | ORAL | Status: DC | PRN
  Administered 2021-05-08 – 2021-05-16 (×8): 25 mg via ORAL
  Filled 2021-05-08: qty 1
  Filled 2021-05-08: qty 10
  Filled 2021-05-08 (×8): qty 1

## 2021-05-08 MED ORDER — OLANZAPINE 10 MG PO TBDP
10.0000 mg | ORAL_TABLET | Freq: Three times a day (TID) | ORAL | Status: DC | PRN
  Administered 2021-05-09 – 2021-05-12 (×2): 10 mg via ORAL
  Filled 2021-05-08 (×2): qty 1

## 2021-05-08 NOTE — ED Notes (Signed)
Pt sleeping at present, no distress noted.  Monitoring for safety. 

## 2021-05-08 NOTE — ED Provider Notes (Signed)
[facility]BC/OBS ASAP Discharge Summary  Date and Time: 05/08/2021 2:57 PM  Name: Alex Kline  MRN:  [id number]   Discharge Diagnoses:  Final diagnoses:  Schizophrenia, unspecified type (HCC)  Severe cannabis use disorder (HCC)    Subjective: Patient is very slow to respond this morning to questions and answers very softly mumbling his words.  He reports that he slept ok last night. He reports he has been able to eat some. He reports that he still has SI but no specific plan. He reports no HI or AH.  He reports he some sees some shadows occasionally but does not seem to be very distressed by this. Does not appear to be responding to internal stimuli. He reports that he would like to continue receiving treatment. He reports no negative side effects from restarting his medications. He has no other concerns at present.  Stay Summary: Patient presented voluntarily via [facility] on 6/2 reporting depression, psychosis, and SI. He reported a history of Schizophrenia but had not taken his medications for months. He reported that he had recently become homeless (had been living with his grandmother). He wanted to be restarted on his medications. He was admitted for Observation and to restart meds. He responded well to restarting his medications. He agreed with continuing to need treatment and to being transferred to the inpatient unit.  Total Time spent with patient: 20 minutes  Past Psychiatric History: Schizophrenia, Bipolar Past Medical History:  Past Medical History:  Diagnosis Date  . Bipolar disorder (HCC)   . Schizophrenia (HCC)    No past surgical history on file. Family History: No family history on file. Family Psychiatric History: None of file Social History:  Social History   Substance and Sexual Activity  Alcohol Use Yes   Comment: occ     Social History   Substance and Sexual Activity  Drug Use Yes  . Types: Marijuana    Social History   Socioeconomic History  . Marital status:  Single    Spouse name: Not on file  . Number of children: Not on file  . Years of education: Not on file  . Highest education level: Not on file  Occupational History  . Not on file  Tobacco Use  . Smoking status: Current Every Day Smoker    Types: Cigarettes  . Smokeless tobacco: Never Used  Substance and Sexual Activity  . Alcohol use: Yes    Comment: occ  . Drug use: Yes    Types: Marijuana  . Sexual activity: Not on file  Other Topics Concern  . Not on file  Social History Narrative  . Not on file   Social Determinants of Health   Financial Resource Strain: Not on file  Food Insecurity: Not on file  Transportation Needs: Not on file  Physical Activity: Not on file  Stress: Not on file  Social Connections: Not on file   SDOH:  SDOH Screenings   Alcohol Screen: Not on file  Depression (PHQ2-9): Medium Risk  . PHQ-2 Score: 27  Financial Resource Strain: Not on file  Food Insecurity: Not on file  Housing: Not on file  Physical Activity: Not on file  Social Connections: Not on file  Stress: Not on file  Tobacco Use: High Risk  . Smoking Tobacco Use: Current Every Day Smoker  . Smokeless Tobacco Use: Never Used  Transportation Needs: Not on file    Has this patient used any form of tobacco in the last 30 days? (Cigarettes, Smokeless Tobacco, Cigars,  and/or Pipes) Prescription not provided because: Transfer from one facility to another  Current Medications:  Current Facility-Administered Medications  Medication Dose Route Frequency Provider Last Rate Last Admin  . acetaminophen (TYLENOL) tablet 650 mg  650 mg Oral Q6H PRN Nira Conn A, NP      . alum & mag hydroxide-simeth (MAALOX/MYLANTA) 200-200-20 MG/5ML suspension 30 mL  30 mL Oral Q4H PRN Nira Conn A, NP      . benztropine (COGENTIN) tablet 1 mg  1 mg Oral BID Nira Conn A, NP   1 mg at 05/08/21 0950  . divalproex (DEPAKOTE) DR tablet 250 mg  250 mg Oral BID Nira Conn A, NP   250 mg at 05/08/21  0950  . FLUoxetine (PROZAC) capsule 20 mg  20 mg Oral Daily Nira Conn A, NP   20 mg at 05/08/21 0951  . haloperidol (HALDOL) tablet 15 mg  15 mg Oral QHS Nira Conn A, NP   15 mg at 05/07/21 2206  . hydrOXYzine (ATARAX/VISTARIL) tablet 25 mg  25 mg Oral TID PRN Nira Conn A, NP      . loperamide (IMODIUM) capsule 2-4 mg  2-4 mg Oral PRN Nira Conn A, NP      . LORazepam (ATIVAN) tablet 1 mg  1 mg Oral Q6H PRN Nira Conn A, NP   1 mg at 05/07/21 2216  . magnesium hydroxide (MILK OF MAGNESIA) suspension 30 mL  30 mL Oral Daily PRN Nira Conn A, NP      . omega-3 acid ethyl esters (LOVAZA) capsule 1 g  1 g Oral BID Nira Conn A, NP   1 g at 05/08/21 0950  . ondansetron (ZOFRAN-ODT) disintegrating tablet 4 mg  4 mg Oral Q6H PRN Nira Conn A, NP      . traZODone (DESYREL) tablet 50 mg  50 mg Oral QHS PRN Jackelyn Poling, NP   50 mg at 05/07/21 2206   Current Outpatient Medications  Medication Sig Dispense Refill  . benztropine (COGENTIN) 1 MG tablet Take 1 tablet (1 mg total) by mouth 2 (two) times daily. (Patient not taking: Reported on 05/08/2021) 60 tablet 2  . divalproex (DEPAKOTE) 250 MG DR tablet Take 1 tablet (250 mg total) by mouth 2 (two) times daily.    Marland Kitchen FLUoxetine (PROZAC) 20 MG capsule Take 1 capsule (20 mg total) by mouth daily. (Patient not taking: Reported on 05/08/2021) 90 capsule 1  . haloperidol (HALDOL) 5 MG tablet Take 3 tablets (15 mg total) by mouth at bedtime. (Patient not taking: Reported on 05/08/2021) 90 tablet 2  . haloperidol decanoate (HALDOL DECANOATE) 100 MG/ML injection Inject 1.5 mLs (150 mg total) into the muscle every 30 (thirty) days. Due 2/26 (Patient not taking: Reported on 05/08/2021) 1 mL 11  . omega-3 acid ethyl esters (LOVAZA) 1 g capsule Take 1 capsule (1 g total) by mouth 2 (two) times daily. (Patient not taking: Reported on 05/08/2021) 60 capsule 2    PTA Medications: (Not in a hospital admission)   Musculoskeletal  Strength & Muscle Tone:  within normal limits Gait & Station: normal Patient leans: N/A  Psychiatric Specialty Exam  Presentation  General Appearance: Casual  Eye Contact:Fair  Speech:Garbled; Slurred  Speech Volume:Decreased  Handedness:Right   Mood and Affect  Mood:Depressed  Affect:Blunt; Restricted; Depressed   Thought Process  Thought Processes:Coherent  Descriptions of Associations:Intact  Orientation:Full (Time, Place and Person)  Thought Content:Logical  Diagnosis of Schizophrenia or Schizoaffective disorder in past: Yes  Duration of  Psychotic Symptoms: Greater than six months   Hallucinations:Hallucinations: Visual Description of Auditory Hallucinations: states that he cannot understand the voices Description of Visual Hallucinations: reports some shadows  Ideas of Reference:None  Suicidal Thoughts:Suicidal Thoughts: Yes, Passive SI Passive Intent and/or Plan: Without Intent; Without Plan  Homicidal Thoughts:Homicidal Thoughts: No HI Passive Intent and/or Plan: Without Intent; Without Plan   Sensorium  Memory:Immediate Fair; Recent Fair  Judgment:Impaired  Insight:Lacking   Executive Functions  Concentration:Fair  Attention Span:Fair  Recall:Fair  Fund of Knowledge:Fair  Language:Fair   Psychomotor Activity  Psychomotor Activity:Psychomotor Activity: Normal   Assets  Assets:Physical Health; Resilience; Desire for Improvement   Sleep  Sleep:Sleep: Fair   Nutritional Assessment (For OBS and FBC admissions only) Has the patient had a weight loss or gain of 10 pounds or more in the last 3 months?: No Has the patient had a decrease in food intake/or appetite?: Yes Does the patient have dental problems?: No Does the patient have eating habits or behaviors that may be indicators of an eating disorder including binging or inducing vomiting?: No Has the patient recently lost weight without trying?: No Has the patient been eating poorly because of a  decreased appetite?: Yes Malnutrition Screening Tool Score: 1    Physical Exam  Physical Exam Vitals and nursing note reviewed.  Constitutional:      General: He is not in acute distress.    Appearance: Normal appearance. He is normal weight. He is not ill-appearing or toxic-appearing.  HENT:     Head: Normocephalic and atraumatic.  Cardiovascular:     Rate and Rhythm: Normal rate.  Pulmonary:     Effort: Pulmonary effort is normal.  Musculoskeletal:        General: Normal range of motion.  Neurological:     Mental Status: He is alert.    Review of Systems  Constitutional: Negative for chills.  Respiratory: Negative for cough and shortness of breath.   Cardiovascular: Negative for chest pain.  Neurological: Negative for weakness and headaches.  Psychiatric/Behavioral: Positive for depression, hallucinations (mild) and suicidal ideas.   Blood pressure 104/82, pulse 81, temperature 97.7 F (36.5 C), temperature source Oral, resp. rate 16, SpO2 100 %. There is no height or weight on file to calculate BMI.  Demographic Factors:  Male and Living alone  Loss Factors: Recent loss of housing  Historical Factors: NA  Risk Reduction Factors:   NA  Continued Clinical Symptoms:  Depression:   Anhedonia  Cognitive Features That Contribute To Risk:  None    Suicide Risk:  Minimal: No identifiable suicidal ideation.  Patients presenting with no risk factors but with morbid ruminations; may be classified as minimal risk based on the severity of the depressive symptoms  Plan Of Care/Follow-up recommendations:  - Activity as tolerated. - Diet as recommended by PCP. - Keep all scheduled follow-up appointments as recommended.  Disposition: Discharge and Readmit to [facility]  Lauro Franklin, MD 05/08/2021, 2:57 PM

## 2021-05-08 NOTE — Discharge Instructions (Addendum)
Will discharge for transfer to [facility] for continued treatment

## 2021-05-08 NOTE — Progress Notes (Signed)
Patient information has been sent to [facility] via secure chat to review for potential admission. Patient meets inpatient criteria per Dr. Wayne Both.   Situation ongoing, CSW will continue to monitor progress.    Signed:  Damita Dunnings, MSW, LCSW-A  05/08/2021 10:58 AM

## 2021-05-08 NOTE — Progress Notes (Signed)
Admission Note: Patient is a 30 years old male admitted to the unit from [facility] for symptoms of depression, medication noncompliance and suicidal ideation with no plan.  Patient presents with irritable affect and mood.  Refused to sign consent for treatment, vital signs or participate with admission assessment.  Patient was verbally aggressive towards male staff.  Patient ambulatory to the unit.  Upon getting to the unit patient started banging on the bathroom door multiple times and threatening to elope.  Refused to follow verbal redirection.  Routine safety checks initiated.  Patient moved to 500 hall.  Unit restriction in place for safety and elopement risk.

## 2021-05-08 NOTE — ED Notes (Signed)
Resting with eyes closed. Rise and fall of chest noted. No new issues noted at this time. Will continue to monitor for safety 

## 2021-05-08 NOTE — Progress Notes (Signed)
Adult Psychoeducational Group Note  Date:  05/08/2021 Time:  8:17 PM  Group Topic/Focus:  Wrap-Up Group:   The focus of this group is to help patients review their daily goal of treatment and discuss progress on daily workbooks.  Participation Level:  Did Not Attend  Participation Quality:  Did not attend  Affect:  Did not attend  Cognitive:  Did not attend  Insight: None  Engagement in Group:  Did not attend  Modes of Intervention:  Did not attend  Additional Comments:   Pt did not attend group  Sandi Mariscal 05/08/2021, 8:17 PM

## 2021-05-08 NOTE — ED Notes (Signed)
Pt offered breakfast;declined. 

## 2021-05-08 NOTE — Progress Notes (Signed)
Pt accepted to [facility] 400-1   Patient meets inpatient criteria per Earlene Plater, MD  Dr. Clifton Custard  is the attending provider.    Call report to [phone]    Marland Mcalpine, LPN @ [facility] notified.     Pt scheduled  to arrive at [facility] after 1500 Today.    Damita Dunnings, MSW, LCSW-A  2:45 PM 05/08/2021

## 2021-05-08 NOTE — ED Notes (Signed)
Report called to Elizabeth, RN at [facility]  

## 2021-05-09 DIAGNOSIS — F200 Paranoid schizophrenia: Principal | ICD-10-CM

## 2021-05-09 LAB — COMPREHENSIVE METABOLIC PANEL
ALT: 10 U/L (ref 0–44)
AST: 11 U/L — ABNORMAL LOW (ref 15–41)
Albumin: 4 g/dL (ref 3.5–5.0)
Alkaline Phosphatase: 68 U/L (ref 38–126)
Anion gap: 7 (ref 5–15)
BUN: 8 mg/dL (ref 6–20)
CO2: 28 mmol/L (ref 22–32)
Calcium: 8.8 mg/dL — ABNORMAL LOW (ref 8.9–10.3)
Chloride: 108 mmol/L (ref 98–111)
Creatinine, Ser: 0.96 mg/dL (ref 0.61–1.24)
GFR, Estimated: >60 mL/min (ref >60)
Glucose, Bld: 85 mg/dL (ref 70–99)
Potassium: 3.7 mmol/L (ref 3.5–5.1)
Sodium: 143 mmol/L (ref 135–145)
Total Bilirubin: 0.4 mg/dL (ref 0.3–1.2)
Total Protein: 6.2 g/dL — ABNORMAL LOW (ref 6.5–8.1)

## 2021-05-09 NOTE — Progress Notes (Signed)
Pt is displaying bizarre behavior this morning. Pt has been seen throwing coffee on the wall by the phone on two different occasions. When pt was confronted about it, he states "Oh I did not know. My bad!" Pt has also been seen attempting to throw away books and crayons that belong to the hospital in the dayroom. Pt is being redirected as needed.

## 2021-05-09 NOTE — Plan of Care (Signed)
  Problem: Education: Goal: Emotional status will improve Outcome: Not Progressing Goal: Mental status will improve Outcome: Not Progressing   Problem: Activity: Goal: Interest or engagement in leisure activities will improve Outcome: Not Progressing   

## 2021-05-09 NOTE — Progress Notes (Signed)
  D:  Patient presents with moderate anxiety and depression rated at a level (5/10).  Patient reports his day overall was (2/10).  Patient states, "Having a little trouble."  Patient was administered Vistaril and Trazadone PRN per MAR.  Patient was observed for only a short period in the dayroom.  Patient denies SI/HI and AVH.  A:  Labs/Vitals monitored; Medication education provided; Patient supported emotionally; Patient asked to communicate his needs, concerns and questions.  R:  Patient remains safe with 15 minute checks; Will continue POC.      05/08/21 2128  Psychosocial Assessment  Patient Complaints Anxiety;Depression  Eye Contact Fair  Facial Expression Anxious;Worried  Affect Anxious;Appropriate to circumstance  Speech Logical/coherent  Interaction Minimal  Motor Activity Slow  Appearance/Hygiene Unremarkable  Behavior Characteristics Cooperative;Anxious  Mood Anxious;Pleasant  Thought Process  Coherency WDL  Content WDL  Delusions None reported or observed  Perception WDL  Hallucination None reported or observed  Judgment Impaired  Confusion WDL  Danger to Self  Current suicidal ideation? Denies  Danger to Others  Danger to Others None reported or observed

## 2021-05-09 NOTE — Progress Notes (Addendum)
Pt presents guarded, paranoid, suspicious /watchful, irritable with blunted affect on interactions. Endorsed +AVH of hearing "seeing and hearing ghosts bang on my window last night". Delusional / paranoid about "the illuminati wishing me bad luck and following me around". However, pt slept 6.5 hours last night. Pt remains preoccupied, observed actively responding to internal stimuli; talking to self at medication window and while pacing in hall. He has been very impulsive as well pouring coffee on hallway phone twice, poured food on his window curtain and ripped it window curtain off in his room, threw his lunch tray on the floor. Refused to speak to assigned psychiatrist when approached "fuck, I don't want to talk right now. I will talk to you tomorrow". Pt received PRN Zyprexa 10 mg PO at 1335 without effect. Continued to escalate in verbal aggression and abuse, threatening towards staff when redirected to get his lunch tray on off the floor outside his door. Geodon 20 mg IM given at 1407 with increased incouragement as he refused PRN Ativan PO when offered "I'm not taking no more medicines". Pt asleep when reassessed at 1445. Support and encouragement offered to pt this shift. All medications given as ordered and effects monitored. Q 15 minutes safety checks maintained.   Pt tolerated all meals and medications well without discomfort. Asleep at this time, respirations noted and unlabored.

## 2021-05-09 NOTE — [facility] Group Notes (Signed)
  [facility] LCSW Group Therapy Note  Date/Time:  05/09/2021 11:15AM-12:00PM  Type of Therapy and Topic:  Group Therapy:  Feelings About Hospitalization  Participation Level:  Did Not Attend   Description of Group This process group involved patients discussing their feelings related to being hospitalized, as well as the benefits they see to being in the hospital.  These feelings and benefits were itemized.  The group then brainstormed specific ways in which they could seek those same benefits when they discharge and return home.  Therapeutic Goals Patient will identify and describe positive and negative feelings related to hospitalization Patient will verbalize benefits of hospitalization to themselves personally Patients will brainstorm together ways they can obtain similar benefits in the outpatient setting, identify barriers to wellness and possible solutions  Summary of Patient Progress:  The patient did not attend  Therapeutic Modalities Cognitive Behavioral Therapy Motivational Interviewing    Ambrose Mantle, LCSW 05/09/2021, 1:11 PM  .

## 2021-05-09 NOTE — Progress Notes (Signed)
Adult Psychoeducational Group Note  Date:  05/09/2021 Time:  11:23 PM  Group Topic/Focus:  Wrap-Up Group:   The focus of this group is to help patients review their daily goal of treatment and discuss progress on daily workbooks.  Participation Level:  Did Not Attend  Participation Quality:  Did Not Attend  Affect:  Did Not Attend  Cognitive:  Did Not Attend  Insight: None  Engagement in Group:  Did Not Attend  Modes of Intervention:  Did Not Attend  Additional Comments:  Pt did not attend evening wrap up group tonight.  Felipa Furnace 05/09/2021, 11:23 PM

## 2021-05-09 NOTE — H&P (Signed)
Psychiatric Admission Assessment Adult  Patient Identification: Alex CornfieldDejuanta Sorber MRN:  [id number] Date of Evaluation:  05/09/2021 Chief Complaint:  Schizophrenia (HCC) [F20.9] Principal Diagnosis: <principal problem not specified> Diagnosis:  Active Problems:   Schizophrenia (HCC)  History of Present Illness: Patient is seen and examined.  Patient is a 30 year old male with a past psychiatric history significant for schizophrenia versus schizoaffective disorder who [facility].  He presented voluntarily and unaccompanied reporting symptoms of depression, psychosis and suicidal ideation.  Review of the electronic medical record revealed his last psychiatric hospitalization at our facility was in April 2021.  His discharge medications at that time included Cogentin, Depakote, fluoxetine, Haldol as well as Haldol Decanoate.  He is a very poor historian and difficult to get a history at this point.  Most of the history is collected from the electronic medical record.  It was noted in the comprehensive clinical assessment that the patient was homeless, admitted smoking marijuana as well as crack cocaine.  Psychiatric consultation was obtained that showed that the patient was still reporting suicidal ideation but no specific plan.  He denied auditory or visual hallucinations.  He denied any homicidal ideation.  His medications in the emergency department included Cogentin, Depakote, fluoxetine, Haldol.  He was transferred to our facility on 05/08/2021.  The patient refused to sign consent for treatment.  The patient was initially to be admitted [facility], but because of his aggressive behavior and attempt to elope as well as refusing to follow verbal redirection he was [facility].  He was given Geodon 20 mg IM and he did decrease in his agitation at that time.  This morning on examination he remains significantly paranoid.  He apparently  threw coffee on the wall on 2 occasions, and had to be redirected.  He was admitted to the hospital for evaluation and stabilization.  Associated Signs/Symptoms: Depression Symptoms:  depressed mood, anhedonia, insomnia, psychomotor agitation, fatigue, feelings of worthlessness/guilt, difficulty concentrating, hopelessness, suicidal thoughts without plan, anxiety, loss of energy/fatigue, disturbed sleep, Duration of Depression Symptoms: Greater than two weeks  (Hypo) Manic Symptoms:  Delusions, Distractibility, Hallucinations, Impulsivity, Irritable Mood, Labiality of Mood, Anxiety Symptoms:  Excessive Worry, Psychotic Symptoms:  Delusions, Hallucinations: Auditory Paranoia, PTSD Symptoms: Negative Total Time spent with patient: 30 minutes  Past Psychiatric History: This is the patient's thirde psychiatric hospitalization as well as a second admission to our facility during 2021.  His first admission was in January of this year.  He has been previously diagnosed with schizophrenia.  He has been previously treated with Risperdal, Zyprexa, Haldol, Cogentin and Depakote.  Reportedly he had side effects or ineffective response to Risperdal and Zyprexa and he is on Haldol Decanoate 150 mg.  He has a history of daily cannabis use over the last several years, and apparently he also had an overdose of Celexa and Topamax in 2013 in an attempt to become intoxicated and was not a suicide attempt.  Is the patient at risk to self? Yes.    Has the patient been a risk to self in the past 6 months? Yes.    Has the patient been a risk to self within the distant past? Yes.    Is the patient a risk to others? No.  Has the patient been a risk to others in the past 6 months? No.  Has the patient been a risk to others within the distant past? No.   Prior  Inpatient Therapy:   Prior Outpatient Therapy:    Alcohol Screening:   Substance Abuse History in the last 12 months:  Yes.   Consequences  of Substance Abuse: Medical Consequences:  Clearly the abuse of substances worsens his psychotic illness Previous Psychotropic Medications: Yes  Psychological Evaluations: Yes  Past Medical History:  Past Medical History:  Diagnosis Date  . Bipolar disorder (HCC)   . Schizophrenia (HCC)    History reviewed. No pertinent surgical history. Family History: History reviewed. No pertinent family history. Family Psychiatric  History: From old records no pertinent family history Tobacco Screening:   Social History:  Social History   Substance and Sexual Activity  Alcohol Use Yes   Comment: occ     Social History   Substance and Sexual Activity  Drug Use Yes  . Types: Marijuana    Additional Social History:                           Allergies:  No Known Allergies Lab Results:  Results for orders placed or performed during the hospital encounter of 05/07/21 (from the past 48 hour(s))  Resp Panel by RT-PCR (Flu A&B, Covid) Nasopharyngeal Swab     Status: None   Collection Time: 05/07/21  8:49 PM   Specimen: Nasopharyngeal Swab; Nasopharyngeal(NP) swabs in vial transport medium  Result Value Ref Range   SARS Coronavirus 2 by RT PCR NEGATIVE NEGATIVE    Comment: (NOTE) SARS-CoV-2 target nucleic acids are NOT DETECTED.  The SARS-CoV-2 RNA is generally detectable in upper respiratory specimens during the acute phase of infection. The lowest concentration of SARS-CoV-2 viral copies this assay can detect is 138 copies/mL. A negative result does not preclude SARS-Cov-2 infection and should not be used as the sole basis for treatment or other patient management decisions. A negative result may occur with  improper specimen collection/handling, submission of specimen other than nasopharyngeal swab, presence of viral mutation(s) within the areas targeted by this assay, and inadequate number of viral copies(<138 copies/mL). A negative result must be combined with clinical  observations, patient history, and epidemiological information. The expected result is Negative.  Fact Sheet for Patients:  BloggerCourse.com  Fact Sheet for Healthcare Providers:  SeriousBroker.it  This test is no t yet approved or cleared by the [place] FDA and  has been authorized for detection and/or diagnosis of SARS-CoV-2 by FDA under an Emergency Use Authorization (EUA). This EUA will remain  in effect (meaning this test can be used) for the duration of the COVID-19 declaration under Section 564(b)(1) of the Act, 21 U.S.C.section 360bbb-3(b)(1), unless the authorization is terminated  or revoked sooner.       Influenza A by PCR NEGATIVE NEGATIVE   Influenza B by PCR NEGATIVE NEGATIVE    Comment: (NOTE) The Xpert Xpress SARS-CoV-2/FLU/RSV plus assay is intended as an aid in the diagnosis of influenza from Nasopharyngeal swab specimens and should not be used as a sole basis for treatment. Nasal washings and aspirates are unacceptable for Xpert Xpress SARS-CoV-2/FLU/RSV testing.  Fact Sheet for Patients: BloggerCourse.com  Fact Sheet for Healthcare Providers: SeriousBroker.it  This test is not yet approved or cleared by the [place] FDA and has been authorized for detection and/or diagnosis of SARS-CoV-2 by FDA under an Emergency Use Authorization (EUA). This EUA will remain in effect (meaning this test can be used) for the duration of the COVID-19 declaration under Section 564(b)(1) of the Act, 21 U.S.C. section 360bbb-3(b)(1),  unless the authorization is terminated or revoked.  Performed at [facility], [address] [place], [place] [address]   HIV Antibody (routine testing w rflx)     Status: None   Collection Time: 05/07/21  8:50 PM  Result Value Ref Range   HIV Screen 4th Generation wRfx Non Reactive Non Reactive    Comment: Performed at  [facility], [address] [place], [place] [address]  POC SARS Coronavirus 2 Ag-ED - Nasal Swab     Status: None (Preliminary result)   Collection Time: 05/07/21  8:50 PM  Result Value Ref Range   SARS Coronavirus 2 Ag Negative Negative  CBC with Differential/Platelet     Status: None   Collection Time: 05/07/21  8:50 PM  Result Value Ref Range   WBC 6.4 4.0 - 10.5 K/uL   RBC 4.97 4.22 - 5.81 MIL/uL   Hemoglobin 13.7 13.0 - 17.0 g/dL   HCT 82.9 56.2 - 13.0 %   MCV 85.5 80.0 - 100.0 fL   MCH 27.6 26.0 - 34.0 pg   MCHC 32.2 30.0 - 36.0 g/dL   RDW 86.5 78.4 - 69.6 %   Platelets 248 150 - 400 K/uL   nRBC 0.0 0.0 - 0.2 %   Neutrophils Relative % 49 %   Neutro Abs 3.1 1.7 - 7.7 K/uL   Lymphocytes Relative 42 %   Lymphs Abs 2.7 0.7 - 4.0 K/uL   Monocytes Relative 7 %   Monocytes Absolute 0.5 0.1 - 1.0 K/uL   Eosinophils Relative 1 %   Eosinophils Absolute 0.1 0.0 - 0.5 K/uL   Basophils Relative 1 %   Basophils Absolute 0.0 0.0 - 0.1 K/uL   Immature Granulocytes 0 %   Abs Immature Granulocytes 0.02 0.00 - 0.07 K/uL    Comment: Performed at [facility], [address] [place], [place] [address]  Comprehensive metabolic panel     Status: Abnormal   Collection Time: 05/07/21  8:50 PM  Result Value Ref Range   Sodium 138 135 - 145 mmol/L   Potassium 3.4 (L) 3.5 - 5.1 mmol/L   Chloride 103 98 - 111 mmol/L   CO2 27 22 - 32 mmol/L   Glucose, Bld 73 70 - 99 mg/dL    Comment: Glucose reference range applies only to samples taken after fasting for at least 8 hours.   BUN <5 (L) 6 - 20 mg/dL   Creatinine, Ser 4.13 0.61 - 1.24 mg/dL   Calcium 8.7 (L) 8.9 - 10.3 mg/dL   Total Protein 5.8 (L) 6.5 - 8.1 g/dL   Albumin 4.0 3.5 - 5.0 g/dL   AST 15 15 - 41 U/L   ALT 12 0 - 44 U/L   Alkaline Phosphatase 73 38 - 126 U/L   Total Bilirubin 0.8 0.3 - 1.2 mg/dL   GFR, Estimated >24 >40 mL/min    Comment: (NOTE) Calculated using the CKD-EPI Creatinine Equation (2021)    Anion  gap 8 5 - 15    Comment: Performed at [facility], [address], [place], [place] [address]  Hemoglobin A1c     Status: None   Collection Time: 05/07/21  8:50 PM  Result Value Ref Range   Hgb A1c MFr Bld 5.5 4.8 - 5.6 %    Comment: (NOTE)         Prediabetes: 5.7 - 6.4         Diabetes: >6.4  Glycemic control for adults with diabetes: <7.0    Mean Plasma Glucose 111 mg/dL    Comment: (NOTE) Performed At: [facility] [address] [place], [place] [id number] Jolene Schimke MD MW:[id number]   Ethanol     Status: None   Collection Time: 05/07/21  8:50 PM  Result Value Ref Range   Alcohol, Ethyl (B) <10 <10 mg/dL    Comment: (NOTE) Lowest detectable limit for serum alcohol is 10 mg/dL.  For medical purposes only. Performed at [facility], [address] [place], [place] [address]   Lipid panel     Status: Abnormal   Collection Time: 05/07/21  8:50 PM  Result Value Ref Range   Cholesterol 141 0 - 200 mg/dL   Triglycerides 644 <034 mg/dL   HDL 31 (L) >74 mg/dL   Total CHOL/HDL Ratio 4.5 RATIO   VLDL 27 0 - 40 mg/dL   LDL Cholesterol 83 0 - 99 mg/dL    Comment:        Total Cholesterol/HDL:CHD Risk Coronary Heart Disease Risk Table                     Men   Women  1/2 Average Risk   3.4   3.3  Average Risk       5.0   4.4  2 X Average Risk   9.6   7.1  3 X Average Risk  23.4   11.0        Use the calculated Patient Ratio above and the CHD Risk Table to determine the patient's CHD Risk.        ATP III CLASSIFICATION (LDL):  <100     mg/dL   Optimal  259-563  mg/dL   Near or Above                    Optimal  130-159  mg/dL   Borderline  875-643  mg/dL   High  >329     mg/dL   Very High Performed at [facility], [address], [place], [place] [address]   TSH     Status: None   Collection Time: 05/07/21  8:50 PM  Result Value Ref Range   TSH 0.678 0.350 - 4.500 uIU/mL    Comment: Performed by a 3rd Generation assay with a  functional sensitivity of <=0.01 uIU/mL. Performed at [facility], [address] [place], [place] [address]   POCT Urine Drug Screen - (ICup)     Status: Abnormal   Collection Time: 05/07/21  8:50 PM  Result Value Ref Range   POC Amphetamine UR None Detected NONE DETECTED (Cut Off Level 1000 ng/mL)   POC Secobarbital (BAR) None Detected NONE DETECTED (Cut Off Level 300 ng/mL)   POC Buprenorphine (BUP) None Detected NONE DETECTED (Cut Off Level 10 ng/mL)   POC Oxazepam (BZO) None Detected NONE DETECTED (Cut Off Level 300 ng/mL)   POC Cocaine UR None Detected NONE DETECTED (Cut Off Level 300 ng/mL)   POC Methamphetamine UR None Detected NONE DETECTED (Cut Off Level 1000 ng/mL)   POC Morphine None Detected NONE DETECTED (Cut Off Level 300 ng/mL)   POC Oxycodone UR None Detected NONE DETECTED (Cut Off Level 100 ng/mL)   POC Methadone UR None Detected NONE DETECTED (Cut Off Level 300 ng/mL)   POC Marijuana UR Positive (A) NONE DETECTED (Cut Off Level 50 ng/mL)  Valproic acid level     Status:  Abnormal   Collection Time: 05/07/21  8:50 PM  Result Value Ref Range   Valproic Acid Lvl <10 (L) 50.0 - 100.0 ug/mL    Comment: RESULTS CONFIRMED BY MANUAL DILUTION Performed at [facility], [address], [place], [place] [address]   POC SARS Coronavirus 2 Ag     Status: None   Collection Time: 05/07/21  9:02 PM  Result Value Ref Range   SARSCOV2ONAVIRUS 2 AG NEGATIVE NEGATIVE    Comment: (NOTE) SARS-CoV-2 antigen NOT DETECTED.   Negative results are presumptive.  Negative results do not preclude SARS-CoV-2 infection and should not be used as the sole basis for treatment or other patient management decisions, including infection  control decisions, particularly in the presence of clinical signs and  symptoms consistent with COVID-19, or in those who have been in contact with the virus.  Negative results must be combined with clinical observations, patient history, and  epidemiological information. The expected result is Negative.  Fact Sheet for Patients: https://www.jennings-kim.com/  Fact Sheet for Healthcare Providers: https://alexander-rogers.biz/  This test is not yet approved or cleared by the [place] FDA and  has been authorized for detection and/or diagnosis of SARS-CoV-2 by FDA under an Emergency Use Authorization (EUA).  This EUA will remain in effect (meaning this test can be used) for the duration of  the COV ID-19 declaration under Section 564(b)(1) of the Act, 21 U.S.C. section 360bbb-3(b)(1), unless the authorization is terminated or revoked sooner.      Blood Alcohol level:  Lab Results  Component Value Date   ETH <10 05/07/2021   ETH <10 12/26/2019    Metabolic Disorder Labs:  Lab Results  Component Value Date   HGBA1C 5.5 05/07/2021   MPG 111 05/07/2021   MPG 111.15 12/26/2019   Lab Results  Component Value Date   PROLACTIN 24.8 (H) 12/26/2019   Lab Results  Component Value Date   CHOL 141 05/07/2021   TRIG 133 05/07/2021   HDL 31 (L) 05/07/2021   CHOLHDL 4.5 05/07/2021   VLDL 27 05/07/2021   LDLCALC 83 05/07/2021   LDLCALC 82 12/26/2019    Current Medications: Current Facility-Administered Medications  Medication Dose Route Frequency Provider Last Rate Last Admin  . acetaminophen (TYLENOL) tablet 650 mg  650 mg Oral Q6H PRN Lauro Franklin, MD      . alum & mag hydroxide-simeth (MAALOX/MYLANTA) 200-200-20 MG/5ML suspension 30 mL  30 mL Oral Q4H PRN Lauro Franklin, MD      . benztropine (COGENTIN) tablet 1 mg  1 mg Oral BID Lauro Franklin, MD   1 mg at 05/09/21 8299  . divalproex (DEPAKOTE) DR tablet 500 mg  500 mg Oral BID Antonieta Pert, MD   500 mg at 05/09/21 0820  . haloperidol (HALDOL) tablet 10 mg  10 mg Oral Daily Antonieta Pert, MD   10 mg at 05/09/21 0820   Or  . haloperidol lactate (HALDOL) injection 10 mg  10 mg Intramuscular Daily Antonieta Pert, MD      . haloperidol (HALDOL) tablet 15 mg  15 mg Oral QHS Antonieta Pert, MD   15 mg at 05/08/21 2127   Or  . haloperidol lactate (HALDOL) injection 15 mg  15 mg Intramuscular QHS Antonieta Pert, MD      . hydrOXYzine (ATARAX/VISTARIL) tablet 25 mg  25 mg Oral TID PRN Lauro Franklin, MD   25 mg at 05/08/21 2128  . OLANZapine zydis (  ZYPREXA) disintegrating tablet 10 mg  10 mg Oral Q8H PRN Antonieta Pert, MD       And  . LORazepam (ATIVAN) tablet 2 mg  2 mg Oral Q6H PRN Antonieta Pert, MD       And  . ziprasidone (GEODON) injection 20 mg  20 mg Intramuscular Q8H PRN Antonieta Pert, MD      . magnesium hydroxide (MILK OF MAGNESIA) suspension 30 mL  30 mL Oral Daily PRN Lauro Franklin, MD      . traZODone (DESYREL) tablet 50 mg  50 mg Oral QHS PRN Lauro Franklin, MD   50 mg at 05/08/21 2128  . ziprasidone (GEODON) injection 20 mg  20 mg Intramuscular Once Antonieta Pert, MD       PTA Medications: Medications Prior to Admission  Medication Sig Dispense Refill Last Dose  . benztropine (COGENTIN) 1 MG tablet Take 1 tablet (1 mg total) by mouth 2 (two) times daily. (Patient not taking: Reported on 05/08/2021) 60 tablet 2   . divalproex (DEPAKOTE) 250 MG DR tablet Take 1 tablet (250 mg total) by mouth 2 (two) times daily.     Marland Kitchen FLUoxetine (PROZAC) 20 MG capsule Take 1 capsule (20 mg total) by mouth daily. (Patient not taking: Reported on 05/08/2021) 90 capsule 1   . haloperidol (HALDOL) 5 MG tablet Take 3 tablets (15 mg total) by mouth at bedtime. (Patient not taking: Reported on 05/08/2021) 90 tablet 2   . haloperidol decanoate (HALDOL DECANOATE) 100 MG/ML injection Inject 1.5 mLs (150 mg total) into the muscle every 30 (thirty) days. Due 2/26 (Patient not taking: Reported on 05/08/2021) 1 mL 11   . omega-3 acid ethyl esters (LOVAZA) 1 g capsule Take 1 capsule (1 g total) by mouth 2 (two) times daily. (Patient not taking: Reported on 05/08/2021) 60  capsule 2     Musculoskeletal: Strength & Muscle Tone: within normal limits Gait & Station: normal Patient leans: N/A            Psychiatric Specialty Exam:  Presentation  General Appearance: Disheveled  Eye Contact:Fair  Speech:Blocked  Speech Volume:Increased  Handedness:Right   Mood and Affect  Mood:Dysphoric; Irritable; Labile  Affect:Congruent   Thought Process  Thought Processes:Disorganized  Duration of Psychotic Symptoms: Greater than six months  Past Diagnosis of Schizophrenia or Psychoactive disorder: Yes  Descriptions of Associations:Loose  Orientation:Partial  Thought Content:Delusions; Paranoid Ideation; Illogical; Rumination  Hallucinations:Hallucinations: Auditory Description of Visual Hallucinations: reports some shadows  Ideas of Reference:Delusions; Paranoia  Suicidal Thoughts:Suicidal Thoughts: No SI Passive Intent and/or Plan: Without Intent; Without Plan  Homicidal Thoughts:Homicidal Thoughts: No   Sensorium  Memory:Immediate Poor; Recent Poor; Remote Poor  Judgment:Impaired  Insight:Lacking   Executive Functions  Concentration:Poor  Attention Span:Poor  Recall:Poor  Fund of Knowledge:Poor  Language:Fair   Psychomotor Activity  Psychomotor Activity:Psychomotor Activity: Increased   Assets  Assets:Desire for Improvement; Resilience   Sleep  Sleep:Sleep: Fair Number of Hours of Sleep: 6    Physical Exam: Physical Exam Vitals and nursing note reviewed.  HENT:     Head: Normocephalic and atraumatic.  Pulmonary:     Effort: Pulmonary effort is normal.  Neurological:     General: No focal deficit present.     Mental Status: He is alert.    Review of Systems  All other systems reviewed and are negative.  There were no vitals taken for this visit. There is no height or weight on file to calculate BMI.  Treatment Plan Summary: Daily contact with patient to assess and evaluate symptoms and  progress in treatment, Medication management and Plan : Patient is seen and examined.  Patient is a 30 year old male with the above-stated past psychiatric history who is admitted secondary to worsening psychosis, some report of suicidal ideation, and noncompliance with medications.  He will be admitted to the hospital.  He will be integrated in the milieu.  He will be encouraged to attend groups.  Unfortunately we are are unsure of any other psychiatric admissions since his admission here in 2021.  It looks as though he has traveled to other facilities after emergency room visits.  Those could suggest psychiatric admissions.  There is nothing in care everywhere about admissions to facilities associated with the electronic medical record.  Unfortunately because of his agitation last evening we have had to increase his Depakote to 500 mg p.o. twice daily, and his Haldol to 10 mg p.o. or IM daily and 15 mg p.o. or IM nightly.  He also has the olanzapine agitation protocol if necessary as well as trazodone.  Review of his admission laboratories revealed a mildly low potassium at 3.4, but normal creatinine at 0.91, normal liver function enzymes.  Lipid panel was normal.  CBC was normal.  Differential was normal.  Hemoglobin A1c was 5.5.  Valproic acid level on admission was less than 10.  TSH was normal at 0.678.  Influenza panel was negative for influenza A, B and coronavirus.  HIV was negative.  Blood alcohol was less than 10.  Drug screen was positive for marijuana.  EKG showed a sinus arrhythmia with a QTc interval of 442.  Currently his vital signs are stable, he is afebrile.  Pulse oximetry on room air was 100%.  Observation Level/Precautions:  15 minute checks  Laboratory:  Chemistry Profile  Psychotherapy:    Medications:    Consultations:    Discharge Concerns:    Estimated LOS:  Other:     Physician Treatment Plan for Primary Diagnosis: <principal problem not specified> Long Term Goal(s):  Improvement in symptoms so as ready for discharge  Short Term Goals: Ability to identify changes in lifestyle to reduce recurrence of condition will improve, Ability to verbalize feelings will improve, Ability to disclose and discuss suicidal ideas, Ability to demonstrate self-control will improve, Ability to identify and develop effective coping behaviors will improve, Ability to maintain clinical measurements within normal limits will improve, Compliance with prescribed medications will improve and Ability to identify triggers associated with substance abuse/mental health issues will improve  Physician Treatment Plan for Secondary Diagnosis: Active Problems:   Schizophrenia (HCC)  Long Term Goal(s): Improvement in symptoms so as ready for discharge  Short Term Goals: Ability to identify changes in lifestyle to reduce recurrence of condition will improve, Ability to verbalize feelings will improve, Ability to disclose and discuss suicidal ideas, Ability to demonstrate self-control will improve, Ability to identify and develop effective coping behaviors will improve, Ability to maintain clinical measurements within normal limits will improve, Compliance with prescribed medications will improve and Ability to identify triggers associated with substance abuse/mental health issues will improve  I certify that inpatient services furnished can reasonably be expected to improve the patient's condition.    Antonieta Pert, MD 6/4/20221:03 PM

## 2021-05-09 NOTE — [facility] Suicide Risk Assessment (Signed)
[facility] Admission Suicide Risk Assessment   Nursing information obtained from:  Patient Demographic factors:  Male Current Mental Status:  Self-harm thoughts Loss Factors:  Financial problems / change in socioeconomic status,Legal issues Historical Factors:  NA Risk Reduction Factors:  Employed  Total Time spent with patient: 30 minutes Principal Problem: <principal problem not specified> Diagnosis:  Active Problems:   Schizophrenia (HCC)  Subjective Data: Patient is seen and examined.  Patient is a 30 year old male with a past psychiatric history significant for schizophrenia versus schizoaffective disorder who presented to the [facility] on 05/07/2021.  He presented voluntarily and unaccompanied reporting symptoms of depression, psychosis and suicidal ideation.  Review of the electronic medical record revealed his last psychiatric hospitalization at our facility was in April 2021.  His discharge medications at that time included Cogentin, Depakote, fluoxetine, Haldol as well as Haldol Decanoate.  He is a very poor historian and difficult to get a history at this point.  Most of the history is collected from the electronic medical record.  It was noted in the comprehensive clinical assessment that the patient was homeless, admitted smoking marijuana as well as crack cocaine.  Psychiatric consultation was obtained that showed that the patient was still reporting suicidal ideation but no specific plan.  He denied auditory or visual hallucinations.  He denied any homicidal ideation.  His medications in the emergency department included Cogentin, Depakote, fluoxetine, Haldol.  He was transferred to our facility on 05/08/2021.  The patient refused to sign consent for treatment.  The patient was initially to be admitted to the [address], but because of his aggressive behavior and attempt to elope as well as refusing to follow verbal redirection he was moved to the [address].  He was  given Geodon 20 mg IM and he did decrease in his agitation at that time.  This morning on examination he remains significantly paranoid.  He apparently threw coffee on the wall on 2 occasions, and had to be redirected.  He was admitted to the hospital for evaluation and stabilization.  Continued Clinical Symptoms:    The "Alcohol Use Disorders Identification Test", Guidelines for Use in Primary Care, Second Edition.  World Science writer [facility]). Score between 0-7:  no or low risk or alcohol related problems. Score between 8-15:  moderate risk of alcohol related problems. Score between 16-19:  high risk of alcohol related problems. Score 20 or above:  warrants further diagnostic evaluation for alcohol dependence and treatment.   CLINICAL FACTORS:   Schizophrenia:   Less than 20 years old Paranoid or undifferentiated type   Musculoskeletal: Strength & Muscle Tone: within normal limits Gait & Station: normal Patient leans: N/A  Psychiatric Specialty Exam:  Presentation  General Appearance: Disheveled  Eye Contact:Fair  Speech:Blocked  Speech Volume:Increased  Handedness:Right   Mood and Affect  Mood:Dysphoric; Irritable; Labile  Affect:Congruent   Thought Process  Thought Processes:Disorganized  Descriptions of Associations:Loose  Orientation:Partial  Thought Content:Delusions; Paranoid Ideation; Illogical; Rumination  History of Schizophrenia/Schizoaffective disorder:Yes  Duration of Psychotic Symptoms:Greater than six months  Hallucinations:Hallucinations: Auditory Description of Visual Hallucinations: reports some shadows  Ideas of Reference:Delusions; Paranoia  Suicidal Thoughts:Suicidal Thoughts: No SI Passive Intent and/or Plan: Without Intent; Without Plan  Homicidal Thoughts:Homicidal Thoughts: No   Sensorium  Memory:Immediate Poor; Recent Poor; Remote Poor  Judgment:Impaired  Insight:Lacking   Executive Functions   Concentration:Poor  Attention Span:Poor  Recall:Poor  Fund of Knowledge:Poor  Language:Fair   Psychomotor Activity  Psychomotor Activity:Psychomotor Activity:  Increased   Assets  Assets:Desire for Improvement; Resilience   Sleep  Sleep:Sleep: Fair Number of Hours of Sleep: 6    Physical Exam: Physical Exam Vitals and nursing note reviewed.  HENT:     Head: Normocephalic and atraumatic.  Pulmonary:     Effort: Pulmonary effort is normal.  Neurological:     General: No focal deficit present.     Mental Status: He is alert.    Review of Systems  All other systems reviewed and are negative.  There were no vitals taken for this visit. There is no height or weight on file to calculate BMI.   COGNITIVE FEATURES THAT CONTRIBUTE TO RISK:  Thought constriction (tunnel vision)    SUICIDE RISK:   Moderate:  Frequent suicidal ideation with limited intensity, and duration, some specificity in terms of plans, no associated intent, good self-control, limited dysphoria/symptomatology, some risk factors present, and identifiable protective factors, including available and accessible social support.  PLAN OF CARE: Patient is seen and examined.  Patient is a 30 year old male with the above-stated past psychiatric history who is admitted secondary to worsening psychosis, some report of suicidal ideation, and noncompliance with medications.  He will be admitted to the hospital.  He will be integrated in the milieu.  He will be encouraged to attend groups.  Unfortunately we are are unsure of any other psychiatric admissions since his admission here in 2021.  It looks as though he has traveled to other facilities after emergency room visits.  Those could suggest psychiatric admissions.  There is nothing in care everywhere about admissions to facilities associated with the electronic medical record.  Unfortunately because of his agitation last evening we have had to increase his Depakote to  500 mg p.o. twice daily, and his Haldol to 10 mg p.o. or IM daily and 15 mg p.o. or IM nightly.  He also has the olanzapine agitation protocol if necessary as well as trazodone.  Review of his admission laboratories revealed a mildly low potassium at 3.4, but normal creatinine at 0.91, normal liver function enzymes.  Lipid panel was normal.  CBC was normal.  Differential was normal.  Hemoglobin A1c was 5.5.  Valproic acid level on admission was less than 10.  TSH was normal at 0.678.  Influenza panel was negative for influenza A, B and coronavirus.  HIV was negative.  Blood alcohol was less than 10.  Drug screen was positive for marijuana.  EKG showed a sinus arrhythmia with a QTc interval of 442.  Currently his vital signs are stable, he is afebrile.  Pulse oximetry on room air was 100%.  I certify that inpatient services furnished can reasonably be expected to improve the patient's condition.   Antonieta Pert, MD 05/09/2021, 12:03 PM

## 2021-05-09 NOTE — [facility] Group Notes (Signed)
[facility] Group Notes:  (Nursing/MHT/Case Management/Adjunct)  Date:  05/09/2021  Time:  11:24 AM  Type of Therapy:  Group Therapy  Participation Level:  Minimal  Participation Quality:  Resistant  Affect:  Not Congruent and Resistant  Cognitive:  Disorganized  Insight:  Lacking  Engagement in Group:  Developing/Improving  Modes of Intervention:  Orientation  Summary of Progress/Problems: His goal for today is to work on thoughts.   Donye Dauenhauer J Coen Miyasato 05/09/2021, 11:24 AM

## 2021-05-10 MED ORDER — HALOPERIDOL LACTATE 5 MG/ML IJ SOLN
20.0000 mg | Freq: Every day | INTRAMUSCULAR | Status: DC
  Filled 2021-05-10 (×4): qty 4

## 2021-05-10 MED ORDER — HALOPERIDOL 5 MG PO TABS
20.0000 mg | ORAL_TABLET | Freq: Every day | ORAL | Status: DC
  Administered 2021-05-10 – 2021-05-12 (×3): 20 mg via ORAL
  Filled 2021-05-10 (×4): qty 4

## 2021-05-10 MED ORDER — TRAZODONE HCL 100 MG PO TABS
100.0000 mg | ORAL_TABLET | Freq: Every evening | ORAL | Status: DC | PRN
  Administered 2021-05-10: 100 mg via ORAL
  Filled 2021-05-10 (×2): qty 1

## 2021-05-10 MED ORDER — DIVALPROEX SODIUM 500 MG PO DR TAB
500.0000 mg | DELAYED_RELEASE_TABLET | Freq: Every day | ORAL | Status: DC
  Administered 2021-05-11 – 2021-05-16 (×6): 500 mg via ORAL
  Filled 2021-05-10 (×8): qty 1

## 2021-05-10 MED ORDER — DIVALPROEX SODIUM 250 MG PO DR TAB
750.0000 mg | DELAYED_RELEASE_TABLET | Freq: Every day | ORAL | Status: DC
  Administered 2021-05-10 – 2021-05-15 (×6): 750 mg via ORAL
  Filled 2021-05-10 (×9): qty 3

## 2021-05-10 NOTE — [facility] Group Notes (Signed)
[facility] LCSW Group Therapy Note  Date/Time:  05/10/2021  11:00AM-12:00PM  Type of Therapy and Topic:  Group Therapy:  Music and Mood  Participation Level:  Did Not Attend   Description of Group: In this process group, members listened to a variety of genres of music and identified that different types of music evoke different responses.  Patients were encouraged to identify music that was soothing for them and music that was energizing for them.  Patients discussed how this knowledge can help with wellness and recovery in various ways including managing depression and anxiety as well as encouraging healthy sleep habits.    Therapeutic Goals: 1. Patients will explore the impact of different varieties of music on mood 2. Patients will verbalize the thoughts they have when listening to different types of music 3. Patients will identify music that is soothing to them as well as music that is energizing to them 4. Patients will discuss how to use this knowledge to assist in maintaining wellness and recovery 5. Patients will explore the use of music as a coping skill  Summary of Patient Progress:  At the beginning of group, patient expressed that he "went mentally insane one time."  When asked how he feels right now, he responded, "Good."  Almost immediately he left the group saying he needed to use the restroom, but he never returned.    Therapeutic Modalities: Solution Focused Brief Therapy Activity   Ambrose Mantle, LCSW

## 2021-05-10 NOTE — [facility] Counselor (Signed)
[facility] LCSW Note  05/10/2021   4:41 PM  Type of Contact and Topic:  PSA Attempt  CSW attempted PSA. Pt too acute and unable to provide concrete responses to PSA prompts.  CSW obtained consent to contact family members Betsy Coder, Gearldine Shown, [phone] and Marc Morgans, Sister, [phone].    Leisa Lenz, LCSW 05/10/2021  4:41 PM

## 2021-05-10 NOTE — Progress Notes (Signed)
Adult Psychoeducational Group Note  Date:  05/10/2021 Time:  10:28 PM  Group Topic/Focus:  Wrap-Up Group:   The focus of this group is to help patients review their daily goal of treatment and discuss progress on daily workbooks.  Participation Level:  Did Not Attend  Participation Quality:  Did Not Attend  Affect:  Did Not Attend  Cognitive:  Did Not Attend  Insight: None  Engagement in Group:  Did Not Attend  Modes of Intervention:  Did Not Attend  Additional Comments:  Pt did not attend evening wrap up group tonight.  Felipa Furnace 05/10/2021, 10:28 PM

## 2021-05-10 NOTE — Progress Notes (Signed)
   05/10/21 2030  COVID-19 Daily Checkoff  Have you had a fever (temp > 37.80C/100F)  in the past 24 hours?  No  If you have had runny nose, nasal congestion, sneezing in the past 24 hours, has it worsened? No  COVID-19 EXPOSURE  Have you traveled outside the state in the past 14 days? No  Have you been in contact with someone with a confirmed diagnosis of COVID-19 or PUI in the past 14 days without wearing appropriate PPE? No  Have you been living in the same home as a person with confirmed diagnosis of COVID-19 or a PUI (household contact)? No  Have you been diagnosed with COVID-19? No

## 2021-05-10 NOTE — Progress Notes (Signed)
   05/09/21 2100  Psych Admission Type (Psych Patients Only)  Admission Status Voluntary  Psychosocial Assessment  Patient Complaints None  Eye Contact Fair  Facial Expression Flat  Affect Appropriate to circumstance;Preoccupied;Sad  Speech Logical/coherent  Interaction Minimal  Motor Activity Slow  Appearance/Hygiene Unremarkable  Behavior Characteristics Guarded;Calm  Mood Suspicious;Preoccupied;Pleasant  Thought Process  Coherency Disorganized  Content Preoccupation;Paranoia  Delusions Paranoid;Persecutory  Perception Hallucinations (+AVH "ghost were banging on his window. Illuminati are wishing me bad & following me")  Hallucination Auditory;Visual  Judgment Impaired  Confusion Moderate  Danger to Self  Current suicidal ideation? Denies  Danger to Others  Danger to Others None reported or observed

## 2021-05-10 NOTE — Progress Notes (Signed)
Alex Kline Progress Note  05/10/2021 12:17 PM Alex Kline  MRN:  [id number] Subjective: Patient is a 30 year old male with a past psychiatric history significant for schizophrenia versus schizoaffective disorder who presented to the [facility] on 05/07/2021.  At that time he reported symptoms of depression, psychosis and suicidal ideation.  Objective: Patient is seen and examined.  Patient is a 30 year old male with the above-stated past psychiatric history who is seen in follow-up.  He continues to be psychotic.  He really was thought blocking today.  He handed me a sheet of paper that said that "I feel like my mind is insane", "the illuminati are after me".  His current Depakote DR dosage is 500 mg p.o. twice daily, and his Haldol remains at 10 mg p.o. twice daily and 15 mg p.o. nightly.  Nursing notes reflect that he only slept 5 hours last night.  He did not have any other periods of time where he was throwing coffee on the walls or tearing down the curtains in his room as he did earlier in the day yesterday.  His vital signs are stable, he is afebrile.  His electrolytes from 6/4 were all essentially normal.  His blood sugar yesterday was 85, and his hemoglobin A1c is 5.5.  EKG showed a normal sinus rhythm with a normal QTc interval.  Principal Problem: <principal problem not specified> Diagnosis: Active Problems:   Schizophrenia (HCC)  Total Time spent with patient: 20 minutes  Past Psychiatric History: See admission H&P  Past Medical History:  Past Medical History:  Diagnosis Date  . Bipolar disorder (HCC)   . Schizophrenia (HCC)    History reviewed. No pertinent surgical history. Family History: History reviewed. No pertinent family history. Family Psychiatric  History: See admission H&P Social History:  Social History   Substance and Sexual Activity  Alcohol Use Yes   Comment: occ     Social History   Substance and Sexual Activity  Drug Use Yes  .  Types: Marijuana    Social History   Socioeconomic History  . Marital status: Single    Spouse name: Not on file  . Number of children: Not on file  . Years of education: Not on file  . Highest education level: Not on file  Occupational History  . Not on file  Tobacco Use  . Smoking status: Current Every Day Smoker    Types: Cigarettes  . Smokeless tobacco: Never Used  Substance and Sexual Activity  . Alcohol use: Yes    Comment: occ  . Drug use: Yes    Types: Marijuana  . Sexual activity: Not on file  Other Topics Concern  . Not on file  Social History Narrative  . Not on file   Social Determinants of Health   Financial Resource Strain: Not on file  Food Insecurity: Not on file  Transportation Needs: Not on file  Physical Activity: Not on file  Stress: Not on file  Social Connections: Not on file   Additional Social History:                         Sleep: Fair  Appetite:  Fair  Current Medications: Current Facility-Administered Medications  Medication Dose Route Frequency Provider Last Rate Last Admin  . acetaminophen (TYLENOL) tablet 650 mg  650 mg Oral Q6H PRN Lauro Franklin, Kline      . alum & mag hydroxide-simeth (MAALOX/MYLANTA) 200-200-20 MG/5ML suspension 30 mL  30  mL Oral Q4H PRN Lauro Franklin, Kline      . benztropine (COGENTIN) tablet 1 mg  1 mg Oral BID Lauro Franklin, Kline   1 mg at 05/10/21 0743  . divalproex (DEPAKOTE) DR tablet 500 mg  500 mg Oral BID Antonieta Pert, Kline   500 mg at 05/10/21 6720  . haloperidol (HALDOL) tablet 10 mg  10 mg Oral Daily Antonieta Pert, Kline   10 mg at 05/10/21 9470   Or  . haloperidol lactate (HALDOL) injection 10 mg  10 mg Intramuscular Daily Antonieta Pert, Kline      . haloperidol (HALDOL) tablet 20 mg  20 mg Oral QHS Antonieta Pert, Kline       Or  . haloperidol lactate (HALDOL) injection 20 mg  20 mg Intramuscular QHS Antonieta Pert, Kline      . hydrOXYzine (ATARAX/VISTARIL)  tablet 25 mg  25 mg Oral TID PRN Lauro Franklin, Kline   25 mg at 05/08/21 2128  . OLANZapine zydis (ZYPREXA) disintegrating tablet 10 mg  10 mg Oral Q8H PRN Antonieta Pert, Kline   10 mg at 05/09/21 1335   And  . LORazepam (ATIVAN) tablet 2 mg  2 mg Oral Q6H PRN Antonieta Pert, Kline       And  . ziprasidone (GEODON) injection 20 mg  20 mg Intramuscular Q8H PRN Antonieta Pert, Kline   20 mg at 05/09/21 1407  . magnesium hydroxide (MILK OF MAGNESIA) suspension 30 mL  30 mL Oral Daily PRN Lauro Franklin, Kline      . traZODone (DESYREL) tablet 50 mg  50 mg Oral QHS PRN Lauro Franklin, Kline   50 mg at 05/09/21 2058  . ziprasidone (GEODON) injection 20 mg  20 mg Intramuscular Once Antonieta Pert, Kline        Lab Results:  Results for orders placed or performed during the hospital encounter of 05/08/21 (from the past 48 hour(s))  Comprehensive metabolic panel     Status: Abnormal   Collection Time: 05/09/21  6:10 PM  Result Value Ref Range   Sodium 143 135 - 145 mmol/L   Potassium 3.7 3.5 - 5.1 mmol/L   Chloride 108 98 - 111 mmol/L   CO2 28 22 - 32 mmol/L   Glucose, Bld 85 70 - 99 mg/dL    Comment: Glucose reference range applies only to samples taken after fasting for at least 8 hours.   BUN 8 6 - 20 mg/dL   Creatinine, Ser 9.62 0.61 - 1.24 mg/dL   Calcium 8.8 (L) 8.9 - 10.3 mg/dL   Total Protein 6.2 (L) 6.5 - 8.1 g/dL   Albumin 4.0 3.5 - 5.0 g/dL   AST 11 (L) 15 - 41 U/L   ALT 10 0 - 44 U/L   Alkaline Phosphatase 68 38 - 126 U/L   Total Bilirubin 0.4 0.3 - 1.2 mg/dL   GFR, Estimated >83 >66 mL/min    Comment: (NOTE) Calculated using the CKD-EPI Creatinine Equation (2021)    Anion gap 7 5 - 15    Comment: Performed at [facility], [address], [place], [place] [address]    Blood Alcohol level:  Lab Results  Component Value Date   [facility] <10 05/07/2021   ETH <10 12/26/2019    Metabolic Disorder Labs: Lab Results  Component Value Date    HGBA1C 5.5 05/07/2021   MPG 111 05/07/2021   MPG 111.15  12/26/2019   Lab Results  Component Value Date   PROLACTIN 24.8 (H) 12/26/2019   Lab Results  Component Value Date   CHOL 141 05/07/2021   TRIG 133 05/07/2021   HDL 31 (L) 05/07/2021   CHOLHDL 4.5 05/07/2021   VLDL 27 05/07/2021   LDLCALC 83 05/07/2021   LDLCALC 82 12/26/2019    Physical Findings: AIMS: Facial and Oral Movements Muscles of Facial Expression: None, normal Lips and Perioral Area: None, normal Jaw: None, normal Tongue: None, normal,Extremity Movements Upper (arms, wrists, hands, fingers): None, normal Lower (legs, knees, ankles, toes): None, normal, Trunk Movements Neck, shoulders, hips: None, normal, Overall Severity Severity of abnormal movements (highest score from questions above): None, normal Incapacitation due to abnormal movements: None, normal Patient's awareness of abnormal movements (rate only patient's report): No Awareness, Dental Status Current problems with teeth and/or dentures?: No Does patient usually wear dentures?: No  CIWA:    COWS:     Musculoskeletal: Strength & Muscle Tone: within normal limits Gait & Station: normal Patient leans: N/A  Psychiatric Specialty Exam:  Presentation  General Appearance: Disheveled  Eye Contact:Fair  Speech:Blocked  Speech Volume:Decreased  Handedness:Right   Mood and Affect  Mood:Dysphoric  Affect:Flat   Thought Process  Thought Processes:Disorganized  Descriptions of Associations:Loose  Orientation:Partial  Thought Content:Delusions; Paranoid Ideation  History of Schizophrenia/Schizoaffective disorder:Yes  Duration of Psychotic Symptoms:Greater than six months  Hallucinations:Hallucinations: None  Ideas of Reference:Delusions; Paranoia  Suicidal Thoughts:Suicidal Thoughts: No  Homicidal Thoughts:Homicidal Thoughts: No   Sensorium  Memory:Immediate Poor; Recent Poor; Remote  Poor  Judgment:Poor  Insight:Lacking   Executive Functions  Concentration:Fair  Attention Span:Fair  Recall:Poor  Fund of Knowledge:Poor  Language:Poor   Psychomotor Activity  Psychomotor Activity:Psychomotor Activity: Decreased   Assets  Assets:Desire for Improvement; Resilience   Sleep  Sleep:Sleep: Fair Number of Hours of Sleep: 5    Physical Exam: Physical Exam Vitals and nursing note reviewed.  HENT:     Head: Normocephalic and atraumatic.  Pulmonary:     Effort: Pulmonary effort is normal.  Neurological:     General: No focal deficit present.     Mental Status: He is alert.    Review of Systems  All other systems reviewed and are negative.  Blood pressure 126/62, pulse (!) 57, temperature 97.7 F (36.5 C), temperature source Oral, SpO2 100 %. There is no height or weight on file to calculate BMI.   Treatment Plan Summary: Daily contact with patient to assess and evaluate symptoms and progress in treatment, Medication management and Plan : Patient is seen and examined.  Patient is a 30 year old male with the above-stated past psychiatric history who is seen in follow-up.   Diagnosis: 1.  Paranoid schizophrenia 2.  Cannabis use disorder  Pertinent findings on examination today: 1.  Significant degree of thought blocking. 2.  Continues to endorse paranoid symptoms including feeling as though "the illuminati are after me". 3.  Sleep is still not great. 4.  Less agitated and irritable than yesterday.  Plan: 1.  Continue Cogentin 1 mg p.o. twice daily for side effects of medication. 2.  Continue Depakote DR but increase dosage to 500 mg p.o. daily and 750 mg p.o. nightly.  This is for mood stability. 3.  Increase haloperidol to 10 mg p.o. twice daily and 20 mg p.o. nightly.  This is for psychosis. 4.  Continue olanzapine agitation protocol. 5.  Increase trazodone 200 mg p.o. nightly as needed insomnia. 6.  Disposition planning-in  progress.  Antonieta PertGreg Lawson Eldridge Marcott, Kline 05/10/2021, 12:17 PM

## 2021-05-10 NOTE — Progress Notes (Signed)
Pt did not attend orientation group.  

## 2021-05-10 NOTE — Progress Notes (Signed)
   05/10/21 2030  Psych Admission Type (Psych Patients Only)  Admission Status Voluntary  Psychosocial Assessment  Patient Complaints None  Eye Contact Fair  Facial Expression Flat  Affect Appropriate to circumstance  Speech Logical/coherent  Interaction Minimal  Motor Activity Slow  Appearance/Hygiene Unremarkable  Behavior Characteristics Appropriate to situation;Cooperative  Mood Preoccupied;Pleasant  Thought Process  Coherency Disorganized;Circumstantial  Content Preoccupation  Delusions Paranoid  Perception Hallucinations  Hallucination Auditory  Judgment Impaired  Confusion Moderate  Danger to Self  Current suicidal ideation? Denies  Danger to Others  Danger to Others None reported or observed

## 2021-05-10 NOTE — Plan of Care (Signed)
Patient often paces and appears to be preoccupied. Unable to stay still in group. Anxious and responding to internal stimuli. He is redirectable with no aggressive behaviors. His appetite is good. He denies SI/HI. Currently in bed napping. Safety precautions maintained.

## 2021-05-11 MED ORDER — OLANZAPINE 5 MG PO TBDP
5.0000 mg | ORAL_TABLET | Freq: Every day | ORAL | Status: DC
  Administered 2021-05-11 – 2021-05-13 (×3): 5 mg via ORAL
  Filled 2021-05-11 (×6): qty 1

## 2021-05-11 MED ORDER — TRAZODONE HCL 150 MG PO TABS
150.0000 mg | ORAL_TABLET | Freq: Every evening | ORAL | Status: DC | PRN
  Administered 2021-05-11 – 2021-05-12 (×2): 150 mg via ORAL
  Filled 2021-05-11 (×2): qty 1

## 2021-05-11 NOTE — Progress Notes (Signed)
Recreation Therapy Notes  INPATIENT RECREATION THERAPY ASSESSMENT  Patient Details Name: Dayden Viverette MRN: [id number] DOB: March 10, 1991 Today's Date: 05/11/2021       Information Obtained From: Patient  Able to Participate in Assessment/Interview: Yes  Patient Presentation: Alert  Reason for Admission (Per Patient): Other (Comments) ("Nothing really just different feelings".  Pt stated he was feeling depressed, sad, anxious, insane and felt his life was being destroyed and unjustified.)  Patient Stressors: Investment banker, corporate (Comment) (Landlord)  Coping Skills:   TV,Music,Exercise,Meditate,Deep Publishing copy  Leisure Interests (2+):  Individual - Other (Comment) ("I work all the time")  Frequency of Recreation/Participation: Other (Comment) (Daily)  Awareness of Community Resources:  Yes  Community Resources:   (Pt was unable to identify any resources eventhought pt states there is access to them.)  Current Use:  (Unable to answer)  If no, Barriers?:    Expressed Interest in [employer] Information: No  [place] of Residence:  [place]  Patient Main Form of Transportation:    Patient Strengths:  Cleaning up; Destroying things; Being healthy in life  Patient Identified Areas of Improvement:  "watch my back"  Patient Goal for Hospitalization:  "watch my front and back"  Current SI (including self-harm):  No  Current HI:  No  Current AVH: No  Staff Intervention Plan: Group Attendance,Collaborate with Interdisciplinary Treatment Team  Consent to Intern Participation: N/A    Caroll Rancher, LRT/CTRS  Lillia Abed, Enijah Furr A 05/11/2021, 1:45 PM

## 2021-05-11 NOTE — Tx Team (Signed)
Interdisciplinary Treatment and Diagnostic Plan Update  05/11/2021 Time of Session: 10:30am Alex Kline MRN: [id number]  Principal Diagnosis: <principal problem not specified>  Secondary Diagnoses: Active Problems:   Schizophrenia ([place])   Current Medications:  Current Facility-Administered Medications  Medication Dose Route Frequency Provider Last Rate Last Admin  . acetaminophen (TYLENOL) tablet 650 mg  650 mg Oral Q6H PRN Briant Cedar, MD      . alum & mag hydroxide-simeth (MAALOX/MYLANTA) 200-200-20 MG/5ML suspension 30 mL  30 mL Oral Q4H PRN Briant Cedar, MD      . benztropine (COGENTIN) tablet 1 mg  1 mg Oral BID Briant Cedar, MD   1 mg at 05/11/21 0947  . divalproex (DEPAKOTE) DR tablet 500 mg  500 mg Oral Daily Sharma Covert, MD   500 mg at 05/11/21 0752  . divalproex (DEPAKOTE) DR tablet 750 mg  750 mg Oral QHS Sharma Covert, MD   750 mg at 05/10/21 2026  . haloperidol (HALDOL) tablet 10 mg  10 mg Oral Daily Sharma Covert, MD   10 mg at 05/11/21 0962   Or  . haloperidol lactate (HALDOL) injection 10 mg  10 mg Intramuscular Daily Sharma Covert, MD      . haloperidol (HALDOL) tablet 20 mg  20 mg Oral QHS Sharma Covert, MD   20 mg at 05/10/21 2025   Or  . haloperidol lactate (HALDOL) injection 20 mg  20 mg Intramuscular QHS Sharma Covert, MD      . hydrOXYzine (ATARAX/VISTARIL) tablet 25 mg  25 mg Oral TID PRN Briant Cedar, MD   25 mg at 05/10/21 1301  . OLANZapine zydis (ZYPREXA) disintegrating tablet 10 mg  10 mg Oral Q8H PRN Sharma Covert, MD   10 mg at 05/09/21 1335   And  . LORazepam (ATIVAN) tablet 2 mg  2 mg Oral Q6H PRN Sharma Covert, MD       And  . ziprasidone (GEODON) injection 20 mg  20 mg Intramuscular Q8H PRN Sharma Covert, MD   20 mg at 05/09/21 1407  . magnesium hydroxide (MILK OF MAGNESIA) suspension 30 mL  30 mL Oral Daily PRN Briant Cedar, MD      . OLANZapine zydis  (ZYPREXA) disintegrating tablet 5 mg  5 mg Oral QHS Sharma Covert, MD      . traZODone (DESYREL) tablet 100 mg  100 mg Oral QHS PRN Sharma Covert, MD   100 mg at 05/10/21 2026  . ziprasidone (GEODON) injection 20 mg  20 mg Intramuscular Once Sharma Covert, MD       PTA Medications: Medications Prior to Admission  Medication Sig Dispense Refill Last Dose  . benztropine (COGENTIN) 1 MG tablet Take 1 tablet (1 mg total) by mouth 2 (two) times daily. (Patient not taking: Reported on 05/08/2021) 60 tablet 2   . divalproex (DEPAKOTE) 250 MG DR tablet Take 1 tablet (250 mg total) by mouth 2 (two) times daily.     Marland Kitchen FLUoxetine (PROZAC) 20 MG capsule Take 1 capsule (20 mg total) by mouth daily. (Patient not taking: Reported on 05/08/2021) 90 capsule 1   . haloperidol (HALDOL) 5 MG tablet Take 3 tablets (15 mg total) by mouth at bedtime. (Patient not taking: Reported on 05/08/2021) 90 tablet 2   . haloperidol decanoate (HALDOL DECANOATE) 100 MG/ML injection Inject 1.5 mLs (150 mg total) into the muscle every 30 (thirty) days. Due 2/26 (Patient not  taking: Reported on 05/08/2021) 1 mL 11   . omega-3 acid ethyl esters (LOVAZA) 1 g capsule Take 1 capsule (1 g total) by mouth 2 (two) times daily. (Patient not taking: Reported on 05/08/2021) 60 capsule 2     Patient Stressors:    Patient Strengths:    Treatment Modalities: Medication Management, Group therapy, Case management,  1 to 1 session with clinician, Psychoeducation, Recreational therapy.   Physician Treatment Plan for Primary Diagnosis: <principal problem not specified> Long Term Goal(s): Improvement in symptoms so as ready for discharge Improvement in symptoms so as ready for discharge   Short Term Goals: Ability to identify changes in lifestyle to reduce recurrence of condition will improve Ability to verbalize feelings will improve Ability to disclose and discuss suicidal ideas Ability to demonstrate self-control will improve Ability  to identify and develop effective coping behaviors will improve Ability to maintain clinical measurements within normal limits will improve Compliance with prescribed medications will improve Ability to identify triggers associated with substance abuse/mental health issues will improve Ability to identify changes in lifestyle to reduce recurrence of condition will improve Ability to verbalize feelings will improve Ability to disclose and discuss suicidal ideas Ability to demonstrate self-control will improve Ability to identify and develop effective coping behaviors will improve Ability to maintain clinical measurements within normal limits will improve Compliance with prescribed medications will improve Ability to identify triggers associated with substance abuse/mental health issues will improve  Medication Management: Evaluate patient's response, side effects, and tolerance of medication regimen.  Therapeutic Interventions: 1 to 1 sessions, Unit Group sessions and Medication administration.  Evaluation of Outcomes: Not Met  Physician Treatment Plan for Secondary Diagnosis: Active Problems:   Schizophrenia ([place])  Long Term Goal(s): Improvement in symptoms so as ready for discharge Improvement in symptoms so as ready for discharge   Short Term Goals: Ability to identify changes in lifestyle to reduce recurrence of condition will improve Ability to verbalize feelings will improve Ability to disclose and discuss suicidal ideas Ability to demonstrate self-control will improve Ability to identify and develop effective coping behaviors will improve Ability to maintain clinical measurements within normal limits will improve Compliance with prescribed medications will improve Ability to identify triggers associated with substance abuse/mental health issues will improve Ability to identify changes in lifestyle to reduce recurrence of condition will improve Ability to verbalize feelings  will improve Ability to disclose and discuss suicidal ideas Ability to demonstrate self-control will improve Ability to identify and develop effective coping behaviors will improve Ability to maintain clinical measurements within normal limits will improve Compliance with prescribed medications will improve Ability to identify triggers associated with substance abuse/mental health issues will improve     Medication Management: Evaluate patient's response, side effects, and tolerance of medication regimen.  Therapeutic Interventions: 1 to 1 sessions, Unit Group sessions and Medication administration.  Evaluation of Outcomes: Not Met   RN Treatment Plan for Primary Diagnosis: <principal problem not specified> Long Term Goal(s): Knowledge of disease and therapeutic regimen to maintain health will improve  Short Term Goals: Ability to remain free from injury will improve, Ability to verbalize frustration and anger appropriately will improve, Ability to identify and develop effective coping behaviors will improve and Compliance with prescribed medications will improve  Medication Management: RN will administer medications as ordered by provider, will assess and evaluate patient's response and provide education to patient for prescribed medication. RN will report any adverse and/or side effects to prescribing provider.  Therapeutic Interventions: 1 on 1  counseling sessions, Psychoeducation, Medication administration, Evaluate responses to treatment, Monitor vital signs and CBGs as ordered, Perform/monitor CIWA, COWS, AIMS and Fall Risk screenings as ordered, Perform wound care treatments as ordered.  Evaluation of Outcomes: Not Met   LCSW Treatment Plan for Primary Diagnosis: <principal problem not specified> Long Term Goal(s): Safe transition to appropriate next level of care at discharge, Engage patient in therapeutic group addressing interpersonal concerns.  Short Term Goals: Engage  patient in aftercare planning with referrals and resources, Increase social support, Increase ability to appropriately verbalize feelings, Facilitate patient progression through stages of change regarding substance use diagnoses and concerns, Identify triggers associated with mental health/substance abuse issues and Increase skills for wellness and recovery  Therapeutic Interventions: Assess for all discharge needs, 1 to 1 time with Social worker, Explore available resources and support systems, Assess for adequacy in community support network, Educate family and significant other(s) on suicide prevention, Complete Psychosocial Assessment, Interpersonal group therapy.  Evaluation of Outcomes: Not Met   Progress in Treatment: Attending groups: Yes. Participating in groups: Yes. Taking medication as prescribed: Yes. Toleration medication: Yes. Family/Significant other contact made: No, will contact:  grandmother Patient understands diagnosis: Yes. Discussing patient identified problems/goals with staff: Yes. Medical problems stabilized or resolved: Yes. Denies suicidal/homicidal ideation: Yes. Issues/concerns per patient self-inventory: No.   New problem(s) identified: No, Describe:  none  New Short Term/Long Term Goal(s): detox, medication management for mood stabilization; elimination of SI thoughts; development of comprehensive mental wellness/sobriety plan   Patient Goals:  Did not attend  Discharge Plan or Barriers:  Patient recently admitted. CSW will continue to follow and assess for appropriate referrals and possible discharge planning.    Reason for Continuation of Hospitalization: Delusions  Mania Medication stabilization Suicidal ideation  Estimated Length of Stay: 3-5 days  Attendees: Patient: Did not attend 05/11/2021 2:05 PM  Physician:  05/11/2021 2:05 PM  Nursing:  05/11/2021 2:05 PM  RN Care Manager: 05/11/2021 2:05 PM  Social Worker: Darletta Moll, LCSW 05/11/2021  2:05 PM  Recreational Therapist:  05/11/2021 2:05 PM  Other:  05/11/2021 2:05 PM  Other:  05/11/2021 2:05 PM  Other: 05/11/2021 2:05 PM    Scribe for Treatment Team: Vassie Moselle, [place] 05/11/2021 2:05 PM

## 2021-05-11 NOTE — Progress Notes (Signed)
[facility] MD Progress Note  05/11/2021 7:57 PM Alex Kline  MRN:  [id number] Subjective:  Patient is a 30 year old male with a past psychiatric history significant for schizophrenia versus schizoaffective disorder who presented to the [facility] on 05/07/2021.  At that time he reported symptoms of depression, psychosis and suicidal ideation.  Objective: Patient is seen and examined.  Patient is a 30 year old male with the above-stated past psychiatric history who is seen in follow-up.  He appears to slowly be improving.  His irritability has decreased a great deal.  He is able to smile and engage today.  He still disorganized, and not a great historian.  We were talking about housing today, and he stated he lived independently.  Review of the electronic medical record revealed that his behavior has improved since admission.  He still mildly disorganized, but denied auditory and visual hallucinations.  He denied suicidal and homicidal ideation.  His vital signs are stable, he is afebrile.  He slept approximately 5.75 hours last night.  No new laboratories.  Principal Problem: <principal problem not specified> Diagnosis: Active Problems:   Schizophrenia ([facility])  Total Time spent with patient: 20 minutes  Past Psychiatric History: See admission H&P  Past Medical History:  Past Medical History:  Diagnosis Date  . Bipolar disorder ([facility])   . Schizophrenia ([facility])    History reviewed. No pertinent surgical history. Family History: History reviewed. No pertinent family history. Family Psychiatric  History: See admission H&P Social History:  Social History   Substance and Sexual Activity  Alcohol Use Yes   Comment: occ     Social History   Substance and Sexual Activity  Drug Use Yes  . Types: Marijuana    Social History   Socioeconomic History  . Marital status: Single    Spouse name: Not on file  . Number of children: Not on file  . Years of education: Not on  file  . Highest education level: Not on file  Occupational History  . Not on file  Tobacco Use  . Smoking status: Current Every Day Smoker    Types: Cigarettes  . Smokeless tobacco: Never Used  Substance and Sexual Activity  . Alcohol use: Yes    Comment: occ  . Drug use: Yes    Types: Marijuana  . Sexual activity: Not on file  Other Topics Concern  . Not on file  Social History Narrative  . Not on file   Social Determinants of Health   Financial Resource Strain: Not on file  Food Insecurity: Not on file  Transportation Needs: Not on file  Physical Activity: Not on file  Stress: Not on file  Social Connections: Not on file   Additional Social History:                         Sleep: Fair  Appetite:  Good  Current Medications: Current Facility-Administered Medications  Medication Dose Route Frequency Provider Last Rate Last Admin  . acetaminophen (TYLENOL) tablet 650 mg  650 mg Oral Q6H PRN Lauro Franklin, MD      . alum & mag hydroxide-simeth (MAALOX/MYLANTA) 200-200-20 MG/5ML suspension 30 mL  30 mL Oral Q4H PRN Lauro Franklin, MD      . benztropine (COGENTIN) tablet 1 mg  1 mg Oral BID Lauro Franklin, MD   1 mg at 05/11/21 1809  . divalproex (DEPAKOTE) DR tablet 500 mg  500 mg Oral Daily Antonieta Pert,  MD   500 mg at 05/11/21 0752  . divalproex (DEPAKOTE) DR tablet 750 mg  750 mg Oral QHS Antonieta Pert, MD   750 mg at 05/10/21 2026  . haloperidol (HALDOL) tablet 10 mg  10 mg Oral Daily Antonieta Pert, MD   10 mg at 05/11/21 7628   Or  . haloperidol lactate (HALDOL) injection 10 mg  10 mg Intramuscular Daily Antonieta Pert, MD      . haloperidol (HALDOL) tablet 20 mg  20 mg Oral QHS Antonieta Pert, MD   20 mg at 05/10/21 2025   Or  . haloperidol lactate (HALDOL) injection 20 mg  20 mg Intramuscular QHS Antonieta Pert, MD      . hydrOXYzine (ATARAX/VISTARIL) tablet 25 mg  25 mg Oral TID PRN Lauro Franklin,  MD   25 mg at 05/10/21 1301  . OLANZapine zydis (ZYPREXA) disintegrating tablet 10 mg  10 mg Oral Q8H PRN Antonieta Pert, MD   10 mg at 05/09/21 1335   And  . LORazepam (ATIVAN) tablet 2 mg  2 mg Oral Q6H PRN Antonieta Pert, MD       And  . ziprasidone (GEODON) injection 20 mg  20 mg Intramuscular Q8H PRN Antonieta Pert, MD   20 mg at 05/09/21 1407  . magnesium hydroxide (MILK OF MAGNESIA) suspension 30 mL  30 mL Oral Daily PRN Lauro Franklin, MD      . OLANZapine zydis (ZYPREXA) disintegrating tablet 5 mg  5 mg Oral QHS Antonieta Pert, MD      . traZODone (DESYREL) tablet 100 mg  100 mg Oral QHS PRN Antonieta Pert, MD   100 mg at 05/10/21 2026  . ziprasidone (GEODON) injection 20 mg  20 mg Intramuscular Once Antonieta Pert, MD        Lab Results: No results found for this or any previous visit (from the past 48 hour(s)).  Blood Alcohol level:  Lab Results  Component Value Date   ETH <10 05/07/2021   ETH <10 12/26/2019    Metabolic Disorder Labs: Lab Results  Component Value Date   HGBA1C 5.5 05/07/2021   MPG 111 05/07/2021   MPG 111.15 12/26/2019   Lab Results  Component Value Date   PROLACTIN 24.8 (H) 12/26/2019   Lab Results  Component Value Date   CHOL 141 05/07/2021   TRIG 133 05/07/2021   HDL 31 (L) 05/07/2021   CHOLHDL 4.5 05/07/2021   VLDL 27 05/07/2021   LDLCALC 83 05/07/2021   LDLCALC 82 12/26/2019    Physical Findings: AIMS: Facial and Oral Movements Muscles of Facial Expression: None, normal Lips and Perioral Area: None, normal Jaw: None, normal Tongue: None, normal,Extremity Movements Upper (arms, wrists, hands, fingers): None, normal Lower (legs, knees, ankles, toes): None, normal, Trunk Movements Neck, shoulders, hips: None, normal, Overall Severity Severity of abnormal movements (highest score from questions above): None, normal Incapacitation due to abnormal movements: None, normal Patient's awareness of abnormal  movements (rate only patient's report): No Awareness, Dental Status Current problems with teeth and/or dentures?: No Does patient usually wear dentures?: No  CIWA:    COWS:     Musculoskeletal: Strength & Muscle Tone: within normal limits Gait & Station: normal Patient leans: N/A  Psychiatric Specialty Exam:  Presentation  General Appearance: Disheveled  Eye Contact:Fair  Speech:Normal Rate  Speech Volume:Increased  Handedness:Right   Mood and Affect  Mood:Dysphoric  Affect:Non-Congruent   Thought Process  Thought Processes:Disorganized  Descriptions of Associations:Loose  Orientation:Full (Time, Place and Person)  Thought Content:Delusions; Paranoid Ideation; Tangential  History of Schizophrenia/Schizoaffective disorder:Yes  Duration of Psychotic Symptoms:Greater than six months  Hallucinations:Hallucinations: Auditory  Ideas of Reference:Delusions; Paranoia  Suicidal Thoughts:Suicidal Thoughts: No  Homicidal Thoughts:Homicidal Thoughts: No   Sensorium  Memory:Immediate Fair; Remote Poor  Judgment:Impaired  Insight:Fair   Executive Functions  Concentration:Fair  Attention Span:Fair  Recall:Poor  Fund of Knowledge:Fair  Language:Fair   Psychomotor Activity  Psychomotor Activity:Psychomotor Activity: Increased   Assets  Assets:Desire for Improvement; Resilience   Sleep  Sleep:Sleep: Fair Number of Hours of Sleep: 5.75    Physical Exam: Physical Exam Vitals and nursing note reviewed.  HENT:     Head: Normocephalic and atraumatic.  Pulmonary:     Effort: Pulmonary effort is normal.  Neurological:     General: No focal deficit present.     Mental Status: He is alert.    Review of Systems  All other systems reviewed and are negative.  Blood pressure 124/62, pulse 97, temperature 98.4 F (36.9 C), temperature source Oral, SpO2 96 %. There is no height or weight on file to calculate BMI.   Treatment Plan  Summary: Daily contact with patient to assess and evaluate symptoms and progress in treatment, Medication management and Plan : Patient is seen and examined.  Patient is a 30 year old male with the above-stated past psychiatric history who is seen in follow-up.    Diagnosis: 1.  Paranoid schizophrenia 2.  Cannabis use disorder  Pertinent findings on examination today: 1.  Still some paranoid thinking, but less thought blocking, less disorganization. 2.  Sleep is slightly improved. 3.  Less focused on "the illuminati". 4.  He continues to look down at a piece of paper to answer my questions so he has a prescription to tell me.  Plan: 1.  Continue Cogentin 1 mg p.o. twice daily for side effects of medication. 2.  Continue Depakote DR 500 mg p.o. daily and 750 mg p.o. nightly for mood stability. 3.  Continue Haldol 10 mg p.o. or IM daily for psychosis. 4.  Continue Haldol 20 mg p.o. or IM nightly for psychosis. 5.  Continue hydroxyzine 25 mg p.o. 3 times daily as needed anxiety. 6.  Continue olanzapine agitation protocol as needed. 7.  Add Zyprexa 5 mg p.o. nightly to augment Haldol and to improve sleep.  This is for psychosis as well. 8.  Increase trazodone to 150 mg p.o. nightly for insomnia. 9.  Depakote level, CBC with differential and liver function enzymes in 2 days. 10.  Disposition planning-in progress. Antonieta Pert, MD 05/11/2021, 7:57 PM

## 2021-05-11 NOTE — Progress Notes (Signed)
   05/11/21 2316  COVID-19 Daily Checkoff  Have you had a fever (temp > 37.80C/100F)  in the past 24 hours?  No  If you have had runny nose, nasal congestion, sneezing in the past 24 hours, has it worsened? No

## 2021-05-11 NOTE — Progress Notes (Signed)
Recreation Therapy Notes  Date: 6.6.22 Time: 1000 Location: 500 Hall Dayroom   Group Topic: Coping Skills   Goal Area(s) Addresses: Patient will define what a coping skill is. Patient will work with peer to create a list of healthy coping skills beginning with each letter of the alphabet. Patient will successfully identify positive coping skills they can use post d/c.  Patient will acknowledge benefit(s) of using learned coping skills post d/c.   Intervention: Group work   Activity: Coping A to Z. Patient asked to identify what a coping skill is and when they use them.  Next patients were given a blank worksheet titled "Coping Skills A-Z" and asked to pair up with a peer. Partners were instructed to come up with at least one positive coping skill per letter of the alphabet, addressing a specific challenge (ex: stress, anger, anxiety, depression, grief, doubt, isolation, self-harm/suicidal thoughts, substance use). Patients were given 15 minutes to brainstorm with their peer, before ideas were presented to the large group. Patients and LRT debriefed on the importance of coping skill selection based on situation and back-up plans when a skill tried is not effective.    Education: Pharmacologist, Scientist, physiological, Discharge Planning.    Education Outcome:  Acknowledges education/Verbalizes understanding/In group clarification offered/Additional education needed   Clinical Observations/Feedback: Pt left after instructions were given and did not return.      Caroll Rancher, LRT/CTRS         Lillia Abed, Cheryln Balcom A 05/11/2021 1:11 PM

## 2021-05-11 NOTE — Progress Notes (Signed)
   05/11/21 1400  Psych Admission Type (Psych Patients Only)  Admission Status Voluntary  Psychosocial Assessment  Patient Complaints None  Eye Contact Fair  Facial Expression Flat  Affect Appropriate to circumstance  Speech Logical/coherent  Interaction Minimal  Motor Activity Slow  Appearance/Hygiene Unremarkable  Behavior Characteristics Appropriate to situation  Mood Preoccupied  Thought Process  Coherency Disorganized;Circumstantial  Content Preoccupation  Delusions Paranoid  Perception Hallucinations  Hallucination Auditory  Judgment Impaired  Confusion Moderate  Danger to Self  Current suicidal ideation? Denies  Danger to Others  Danger to Others None reported or observed  Dar Note: Patient presents with a calm affect and mood.  Observed responding to internal stimuli and laughing inappropriately.  Medications given as prescribed.  Routine safety checks maintained.  Patient is safe on the unit.  Attended group and participated.

## 2021-05-11 NOTE — [facility] Group Notes (Signed)
LCSW Group Therapy Notes  Type of Therapy and Topic: Group Therapy: Healthy Vs. Unhealthy Coping Strategies  Date and Time: 05/11/2021 at 1:00PM  Participation Level: [facility] PARTICIPATION LEVEL: Did Not Attend  Description of Group:  In this group, patients will be encouraged to explore their healthy and unhealthy coping strategics. Coping strategies are actions that we take to deal with stress, problems, or uncomfortable emotions in our daily lives. Each patient will be challenged to read some scenarios and discuss the unhealthy and healthy coping strategies within those scenarios. Also, each patient will be challenged to describe current healthy and unhealthy strategies that they use in their own lives and discuss the outcomes and barriers to those strategies. This group will be process-oriented, with patients participating in exploration of their own experiences as well as giving and receiving support and challenge from other group members.  Therapeutic Goals: 1. Patient will identify personal healthy and unhealthy coping strategies. 2. Patient will identify healthy and unhealthy coping strategies, in others, through scenarios.  3. Patient will identify expected outcomes of healthy and unhealthy coping strategies. 4. Patient will identify barriers to using healthy coping strategies.   Summary of Patient Progress: Did not attend.      Therapeutic Modalities: Cognitive Behavioral Therapy Solution Focused Therapy Motivational Interviewing    Adriyanna Christians MSW, LCSW Clincal Social Worker  [facility]      

## 2021-05-11 NOTE — Progress Notes (Signed)
Pt kept to himself much of the evening. Pt given PRN Trazodone per MAR with HS medication. Pt continues to appear to be responding to internal stimuli at times.     05/11/21 2300  Psych Admission Type (Psych Patients Only)  Admission Status Voluntary  Psychosocial Assessment  Patient Complaints None  Eye Contact Fair  Facial Expression Flat  Affect Appropriate to circumstance  Speech Logical/coherent  Interaction Minimal  Motor Activity Slow  Appearance/Hygiene Unremarkable  Behavior Characteristics Cooperative  Mood Preoccupied  Thought Process  Coherency Disorganized;Circumstantial  Content Preoccupation  Delusions Paranoid  Perception Hallucinations  Hallucination Auditory  Judgment Impaired  Confusion Moderate  Danger to Self  Current suicidal ideation? Denies  Danger to Others  Danger to Others None reported or observed

## 2021-05-11 NOTE — Plan of Care (Signed)
  Problem: Activity: Goal: Imbalance in normal sleep/wake cycle will improve Outcome: Progressing   Problem: Coping: Goal: Coping ability will improve Outcome: Progressing   

## 2021-05-11 NOTE — [facility] Group Notes (Signed)
[facility] Group Notes:  (Nursing/MHT/Case Management/Adjunct)  Date:  05/11/2021  Time:  10:56 AM  Type of Therapy:  Goal setting group  Participation Level:  Active  Participation Quality:  Attentive and Redirectable  Affect:  Flat  Cognitive:  Disorganized  Insight:  Limited  Engagement in Group:  Distracting and Off Topic  Modes of Intervention:  Discussion and Orientation  Summary of Progress/Problems:  Alex Kline reported his sleep was ok.  He talked about the reason he was admitted.  He is concerned about his discharge plan.  He reported his goal today was "discharge and work on going somewhere other than the homeless shelter."  [name] 05/11/2021, 10:56 AM

## 2021-05-12 MED ORDER — DIPHENHYDRAMINE HCL 50 MG/ML IJ SOLN: 50.0000 mg | Freq: Four times a day (QID) | INTRAMUSCULAR | Status: DC | PRN

## 2021-05-12 NOTE — Progress Notes (Signed)
Recreation Therapy Notes  Date: 6.7.22 Time: 0955 Location: 500 Hall  Group Topic: Communication, Team Building, Problem Solving  Goal Area(s) Addresses:  Patient will effectively work with peer towards shared goal.  Patient will identify skills used to make activity successful.  Patient will identify how skills used during activity can be used to reach post d/c goals.   Intervention: Teambuilding Activity  Activity: [employer]. Working in teams, patients were asked to use colored discs to get the entire team from one end of the hall way to the other. Patients were only allowed to move down and back the hallway by stepping on the discs, patient teams were provided 1 additional disc to assist with them completing task.    Education: Pharmacist, community, Scientist, physiological, Discharge Planning   Education Outcome: Acknowledges education.   Clinical Observations/Feedback: Pt did not attend group session.     Caroll Rancher, LRT/CTRS    Caroll Rancher A 05/12/2021 12:36 PM

## 2021-05-12 NOTE — Progress Notes (Signed)
Pt did not attend orientation group.  

## 2021-05-12 NOTE — Progress Notes (Signed)
[facility] MD Progress Note  05/12/2021 5:04 PM Alex Kline  MRN:  [id number] Reason for admission:  Patient is a 30 year old male with a past psychiatric history significant for schizophrenia versus schizoaffective disorder who presented to the [facility] on 05/07/2021.  At that time he reported symptoms of depression, psychosis and suicidal ideation.  Objective: Medical record reviewed.  Patient's case discussed in detail with members of the treatment team.  I met with and evaluated the patient on the unit for follow-up today.  The patient is guarded and suspicious about speaking with me and perseverates on wanting to talk to a mental health doctor rather than a psychiatrist.  He reluctantly engages in brief conversation with me.  He shows me a handwritten list of reasons why he is in the hospital.  The patient remains disorganized and at times it is difficult to follow his train of thought.  When asked whether his medications are helpful, he states that they help with his thinking and with his mental health but he is unable to provide any further details.  He reports that he is sleeping and eating well.  The patient denies any physical problems.  He denies SI, AI, HI.  He declines to answer questions regarding hallucinations.  Toward the end of the interview the patient spontaneously makes some paranoid statements about being concerned about "people and little kids getting their head chopped off in [place]" and asks if I have heard about this.  He terminates the conversation early and walks down the hall talking to himself in apparent response to internal stimuli.  He slept 5.75 hours last night.  There are no new labs today.  His vital signs include BP of 74/60 sitting and 108/86 standing, pulse of 76 sitting and 99 standing and temperature of 98.7.  The patient has been taking oral standing dose medications as prescribed.  Principal Problem: <principal problem not  specified> Diagnosis: Active Problems:   Schizophrenia ([place])  Total Time spent with patient: 20 minutes  Past Psychiatric History: See admission H&P  Past Medical History:  Past Medical History:  Diagnosis Date  . Bipolar disorder ([place])   . Schizophrenia ([place])    History reviewed. No pertinent surgical history. Family History: History reviewed. No pertinent family history. Family Psychiatric  History: See admission H&P Social History:  Social History   Substance and Sexual Activity  Alcohol Use Yes   Comment: occ     Social History   Substance and Sexual Activity  Drug Use Yes  . Types: Marijuana    Social History   Socioeconomic History  . Marital status: Single    Spouse name: Not on file  . Number of children: Not on file  . Years of education: Not on file  . Highest education level: Not on file  Occupational History  . Not on file  Tobacco Use  . Smoking status: Current Every Day Smoker    Types: Cigarettes  . Smokeless tobacco: Never Used  Substance and Sexual Activity  . Alcohol use: Yes    Comment: occ  . Drug use: Yes    Types: Marijuana  . Sexual activity: Not on file  Other Topics Concern  . Not on file  Social History Narrative  . Not on file   Social Determinants of Health   Financial Resource Strain: Not on file  Food Insecurity: Not on file  Transportation Needs: Not on file  Physical Activity: Not on file  Stress: Not on file  Social Connections: Not on file   Additional Social History:                         Sleep: Fair  Appetite:  Good  Current Medications: Current Facility-Administered Medications  Medication Dose Route Frequency Provider Last Rate Last Admin  . acetaminophen (TYLENOL) tablet 650 mg  650 mg Oral Q6H PRN Briant Cedar, MD      . alum & mag hydroxide-simeth (MAALOX/MYLANTA) 200-200-20 MG/5ML suspension 30 mL  30 mL Oral Q4H PRN Briant Cedar, MD      . benztropine (COGENTIN) tablet 1  mg  1 mg Oral BID Briant Cedar, MD   1 mg at 05/12/21 0820  . divalproex (DEPAKOTE) DR tablet 500 mg  500 mg Oral Daily Sharma Covert, MD   500 mg at 05/12/21 0820  . divalproex (DEPAKOTE) DR tablet 750 mg  750 mg Oral QHS Sharma Covert, MD   750 mg at 05/11/21 2059  . haloperidol (HALDOL) tablet 10 mg  10 mg Oral Daily Sharma Covert, MD   10 mg at 05/12/21 0820   Or  . haloperidol lactate (HALDOL) injection 10 mg  10 mg Intramuscular Daily Sharma Covert, MD      . haloperidol (HALDOL) tablet 20 mg  20 mg Oral QHS Sharma Covert, MD   20 mg at 05/11/21 2058   Or  . haloperidol lactate (HALDOL) injection 20 mg  20 mg Intramuscular QHS Sharma Covert, MD      . hydrOXYzine (ATARAX/VISTARIL) tablet 25 mg  25 mg Oral TID PRN Briant Cedar, MD   25 mg at 05/12/21 1318  . OLANZapine zydis (ZYPREXA) disintegrating tablet 10 mg  10 mg Oral Q8H PRN Sharma Covert, MD   10 mg at 05/12/21 1318   And  . LORazepam (ATIVAN) tablet 2 mg  2 mg Oral Q6H PRN Sharma Covert, MD       And  . ziprasidone (GEODON) injection 20 mg  20 mg Intramuscular Q8H PRN Sharma Covert, MD   20 mg at 05/09/21 1407  . magnesium hydroxide (MILK OF MAGNESIA) suspension 30 mL  30 mL Oral Daily PRN Briant Cedar, MD      . OLANZapine zydis (ZYPREXA) disintegrating tablet 5 mg  5 mg Oral QHS Sharma Covert, MD   5 mg at 05/11/21 2058  . traZODone (DESYREL) tablet 150 mg  150 mg Oral QHS PRN Sharma Covert, MD   150 mg at 05/11/21 2057  . ziprasidone (GEODON) injection 20 mg  20 mg Intramuscular Once Sharma Covert, MD        Lab Results: No results found for this or any previous visit (from the past 48 hour(s)).  Blood Alcohol level:  Lab Results  Component Value Date   ETH <10 05/07/2021   ETH <10 15/17/6160    Metabolic Disorder Labs: Lab Results  Component Value Date   HGBA1C 5.5 05/07/2021   MPG 111 05/07/2021   MPG 111.15 12/26/2019   Lab  Results  Component Value Date   PROLACTIN 24.8 (H) 12/26/2019   Lab Results  Component Value Date   CHOL 141 05/07/2021   TRIG 133 05/07/2021   HDL 31 (L) 05/07/2021   CHOLHDL 4.5 05/07/2021   VLDL 27 05/07/2021   LDLCALC 83 05/07/2021   LDLCALC 82 12/26/2019    Physical Findings: AIMS: Facial and Oral Movements Muscles  of Facial Expression: None, normal Lips and Perioral Area: None, normal Jaw: None, normal Tongue: None, normal,Extremity Movements Upper (arms, wrists, hands, fingers): None, normal Lower (legs, knees, ankles, toes): None, normal, Trunk Movements Neck, shoulders, hips: None, normal, Overall Severity Severity of abnormal movements (highest score from questions above): None, normal Incapacitation due to abnormal movements: None, normal Patient's awareness of abnormal movements (rate only patient's report): No Awareness, Dental Status Current problems with teeth and/or dentures?: No Does patient usually wear dentures?: No  CIWA:    COWS:     Musculoskeletal: Strength & Muscle Tone: within normal limits Gait & Station: normal Patient leans: N/A  Psychiatric Specialty Exam:  Presentation  General Appearance: Casual  Eye Contact:Fair  Speech:Normal Rate  Speech Volume:Normal  Handedness:Right   Mood and Affect  Mood:Anxious; Dysphoric  Affect:Congruent   Thought Process  Thought Processes:Disorganized  Descriptions of Associations:Loose  Orientation:Full (Time, Place and Person)  Thought Content:Delusions; Paranoid Ideation; Tangential  History of Schizophrenia/Schizoaffective disorder:Yes  Duration of Psychotic Symptoms:Greater than six months  Hallucinations:Hallucinations: Auditory  Ideas of Reference:Delusions; Paranoia  Suicidal Thoughts:Suicidal Thoughts: No  Homicidal Thoughts:Homicidal Thoughts: No   Sensorium  Memory:Immediate Fair; Remote Poor  Judgment:Impaired  Insight:Shallow   Executive Functions   Concentration:Fair  Attention Span:Fair  [place]   Psychomotor Activity  Psychomotor Activity:Psychomotor Activity: Increased   Assets  Assets:Desire for Improvement; Resilience   Sleep  Sleep:Sleep: Fair Number of Hours of Sleep: 5.75    Physical Exam: Physical Exam Vitals and nursing note reviewed.  HENT:     Head: Normocephalic and atraumatic.  Pulmonary:     Effort: Pulmonary effort is normal.  Neurological:     General: No focal deficit present.     Mental Status: He is alert.    Review of Systems  All other systems reviewed and are negative.  Blood pressure 108/86, pulse 99, temperature 98.7 F (37.1 C), temperature source Oral, SpO2 96 %. There is no height or weight on file to calculate BMI.   Treatment Plan Summary: Daily contact with patient to assess and evaluate symptoms and progress in treatment, Medication management and Plan :    Diagnosis: 1.  Paranoid schizophrenia 2.  Cannabis use disorder  Pertinent findings on examination today: 1.  Still some paranoid thinking and disorganization, but less thought blocking. 2.  Sleep is slightly improved. 3.  Less focused on "the illuminati". 4.  He continues to look down at a piece of paper to answer my questions so he has a prescription to tell me.  Plan: 1.  Continue Cogentin 1 mg p.o. twice daily for side effects of medication. 2.  Continue Depakote DR 500 mg p.o. daily and 750 mg p.o. nightly for mood stability. 3.  Continue Haldol 10 mg p.o. or IM daily for psychosis. 4.  Continue Haldol 20 mg p.o. or IM nightly for psychosis. 5.  Continue hydroxyzine 25 mg p.o. 3 times daily as needed anxiety. 6.  Continue olanzapine agitation protocol as needed. 7.  Continue Zyprexa 5 mg p.o. nightly to augment Haldol and to improve sleep.  This is for psychosis as well. 8.  Continue trazodone to 150 mg p.o. nightly for insomnia. 9.  VPA level, CBC with  differential and liver function enzymes with a.m. draw on 05/14/2021 10.  Disposition planning-in progress. Arthor Captain, MD 05/12/2021, 5:04 PM

## 2021-05-12 NOTE — Progress Notes (Signed)
Pt received PRN Vistaril and Zyprexa as ordered for anxiety and agitation both at 1318 post lunch. Pt observed to be hyperactive this afternoon with increase pacing, talking to self, threw a container of cookie across the table in a agitated state. Reports he slept well last night with good appetite. Denies SI, HI, VH and pain when assessed but admits to still hearing voices when assessed. Pt attended pharmacy group only this shift but not recreational and nursing group despite multiple prompts. All medications given with verbal education and effects monitored. Safety checks maintained at Q 15 minutes intervals without self harm gestures /outburst thus far. Emotional support and encouragement offered to pt this shift. Pt remains medication compliant, tolerates all meals well without discomfort. Noted to be calmer, animated with inappropriate laughter in dayroom when reassessed at 1415.

## 2021-05-12 NOTE — Plan of Care (Signed)
  Problem: Activity: Goal: Imbalance in normal sleep/wake cycle will improve Outcome: Progressing   Problem: Coping: Goal: Coping ability will improve Outcome: Progressing   

## 2021-05-12 NOTE — Progress Notes (Signed)
   05/12/21 2153  COVID-19 Daily Checkoff  Have you had a fever (temp > 37.80C/100F)  in the past 24 hours?  No  If you have had runny nose, nasal congestion, sneezing in the past 24 hours, has it worsened? No

## 2021-05-12 NOTE — [facility] Suicide Risk Assessment (Signed)
[facility] INPATIENT:  Family/Significant Other Suicide Prevention Education  Suicide Prevention Education:  Education Completed; Sister, Alex Kline [phone] has been identified by the patient as the family member/significant other with whom the patient will be residing, and identified as the person(s) who will aid the patient in the event of a mental health crisis (suicidal ideations/suicide attempt).  With written consent from the patient, the family member/significant other has been provided the following suicide prevention education, prior to the and/or following the discharge of the patient.   CSW spoke with this patients sister who reports that this patient has been incarcerated multiple times throughout his life beginning at the age of 30y.o. She states that he was released from prison and came home, however he seemed a little "off." She stated in January of last year he was having command hallucinations to harm his mother. He stopped taking his medications around this time, however with assistance from family was able to obtain a job and an apartment. Sister states that around April of this year he became paranoid feeling that people on the bus were out to get him or had something against him. He then quit his job and lost his apartment due to neighbors feeling unsafe and having not paid his rent for 6 months. He was being [facility] through [facility] and had been on a monthly injection. Family has been very supportive of him and assisted him in many ways but feel that they cannot continue to do this for him. He currently is homeless and is unable to stay with any family members at discharge.     The suicide prevention education provided includes the following:  Suicide risk factors  Suicide prevention and interventions  National Suicide Hotline telephone number  [facility] assessment telephone number  [facility] Emergency Assistance 911  [facility] and/or Residential  Mobile Crisis Unit telephone number  Request made of family/significant other to:  Remove weapons (e.g., guns, rifles, knives), all items previously/currently identified as safety concern.    Remove drugs/medications (over-the-counter, prescriptions, illicit drugs), all items previously/currently identified as a safety concern.  The family member/significant other verbalizes understanding of the suicide prevention education information provided.  The family member/significant other agrees to remove the items of safety concern listed above.  Alex Kline 05/12/2021, 1:30 PM

## 2021-05-12 NOTE — Progress Notes (Signed)
Adult Psychoeducational Group Note  Date:  05/12/2021 Time:  10:18 PM  Group Topic/Focus:  Wrap-Up Group:   The focus of this group is to help patients review their daily goal of treatment and discuss progress on daily workbooks.  Participation Level:  Did Not Attend  Participation Quality:  Did Not Attend  Affect:  Did Not Attend  Cognitive:  Did Not Attend  Insight: None  Engagement in Group:  Did Not Attend  Modes of Intervention:  Did Not Attend  Additional Comments:  Pt did not attend evening wrap up group tonight.  Felipa Furnace 05/12/2021, 10:18 PM

## 2021-05-12 NOTE — Progress Notes (Signed)
Pt continues to deny AVH, but pt can be seen responding to internal stimuli at times on the unit     05/12/21 2100  Psych Admission Type (Psych Patients Only)  Admission Status Voluntary  Psychosocial Assessment  Patient Complaints Suspiciousness  Eye Contact Fair  Facial Expression Anxious;Animated  Affect Appropriate to circumstance  Speech Logical/coherent  Interaction Minimal;Defensive  Motor Activity Slow  Appearance/Hygiene Unremarkable  Behavior Characteristics Cooperative;Guarded  Mood Suspicious;Preoccupied  Thought Process  Coherency Disorganized;Circumstantial  Content Preoccupation  Delusions Paranoid  Perception Hallucinations  Hallucination Auditory  Judgment Impaired  Confusion Moderate  Danger to Self  Current suicidal ideation? Denies  Danger to Others  Danger to Others None reported or observed

## 2021-05-12 NOTE — [facility] Counselor (Addendum)
Adult Comprehensive Assessment  Patient ID: Alex Kline, male   DOB: 1991/03/15, 30 y.o.   MRN: [id number]  Information Source: Information source: Patient  Current Stressors:  Patient states their primary concerns and needs for treatment are:: "I lost my apartment, the landlord was taking my money" Patient states their goals for this hospitilization and ongoing recovery are:: "Just to be on best behavior man" Educational / Learning stressors: No Employment / Job issues: "I'm going to get my job back when I leave" Family Relationships: "There's a lot going on, I was doing too much in the streetsEngineer, petroleum / Lack of resources (include bankruptcy): "Not a problem, It is a problem cause 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 Who else lives in the home?: Pt evicted two days ago. How long has patient lived in current situation?: Prior to admission What is atmosphere in current home: Chaotic  Family History:  Marital status: Single Are you sexually active?: Yes What is your sexual orientation?: heterosexual Does patient have children?: Yes How many children?: 1 How is patient's relationship with their children?: "I don't see him, me and my baby momma don't bother with each other"  Childhood History:  By whom was/is the patient raised?: Both parents Additional childhood history information: "Good" Description of patient's relationship with caregiver when they were a child: "I had a good bond  with my family" Patient's description of current relationship with people who raised him/her: "They both had cancer, it's good" How were you disciplined when you got in trouble as a child/adolescent?: Whoopings Does patient have siblings?: Yes Number of Siblings: 7 Description of patient's current relationship with siblings: "Good, everybody straight" Did patient suffer any verbal/emotional/physical/sexual abuse as a child?: No Did patient suffer from severe childhood neglect?: No Has patient ever been sexually abused/assaulted/raped as an adolescent or adult?: No Was the patient ever a victim of a crime or a disaster?: No Witnessed domestic violence?: No Has patient been affected by domestic violence as an adult?: No  Education:  Highest grade of school patient has completed: 9th grade Currently a student?: No Learning disability?: No  Employment/Work Situation:  Employment situation: Unemployed Patient's job has been impacted by current illness: Yes Describe how patient's job has been impacted: "I can't keep focused" What is the longest time patient has a held a job?: 2 months Where was the patient employed at that time?: [employer]  Has patient ever been in the [employer]?: No Are There Guns or Other Weapons in Your Home?: No  Financial Resources:  Financial resources: No income Does patient have a Lawyer or guardian?: No  Alcohol/Substance Abuse:  What has been your use of drugs/alcohol within the last 12 months?: Around an 8th of THC daily. 40-60 worth of cocaine every two weeks. Alcohol/Substance Abuse Treatment Hx: Denies past history Has alcohol/substance abuse ever caused legal problems?: No  Social Support System: Patient's Community Support System: Good Describe Community Support System: Dad Type of faith/religion: None How does patient's faith help to cope with current illness?: N/A  Leisure/Recreation:  Leisure and Hobbies:  None  Strengths/Needs:  What things does the patient do well?: "Helping people" Patient states they can use these personal strengths during their treatment to contribute to their recovery: Unsure Patient states these barriers may affect/interfere with their treatment: None Patient states these barriers may affect their return to the community: None Other important information patient would like considered in planning for their treatment: None  Discharge Plan:   Currently receiving community mental health services: No Patient states concerns and preferences for aftercare planning are: Patient is interested in being set up with services at discharge for continued care Patient states they will know when they are safe and ready for discharge when: Yes, feels ready now Does patient have access to transportation?: No Does patient have financial barriers related to discharge medications?: No Patient description of barriers related to discharge medications: Has no income and no insurance Plan for no access to transportation at discharge: CSW will continue to assess Plan for living situation after discharge: CSW will continue to assess Will patient be returning to same living situation after discharge?: No  Summary/Recommendations: Summary and Recommendations (to be completed by the evaluator): Alex Kline was admitted with a past psyxhiatric hx of schizoaffective disorder. Recent stressors include losing his apartment, having no income, being "on the streets", and experiencing suicidal ideation. Pt currently sees no outpatient providers. While here, Alex Kline can benefit from crisis stabilization, medication management, therapeutic milieu, and referrals for services.      Alex Kline A Gelene Recktenwald. 05/12/2021

## 2021-05-12 NOTE — [facility] Suicide Risk Assessment (Signed)
[facility] INPATIENT:  Family/Significant Other Suicide Prevention Education  Suicide Prevention Education:  Contact Attempts: Jerral Bonito [phone] or Sister, Marc Morgans [phone] has been identified by the patient as the family member/significant other with whom the patient will be residing, and identified as the person(s) who will aid the patient in the event of a mental health crisis.  With written consent from the patient, two attempts were made to provide suicide prevention education, prior to and/or following the patient's discharge.  We were unsuccessful in providing suicide prevention education.  A suicide education pamphlet was given to the patient to share with family/significant other.  Date and time of first attempt: 05/12/2021 1:15PM CSW left HIPAA compliant voicemail's for both pt's sister and grandmother. Date and time of second attempt: Second attempts still need to be made.   Neave Lenger A Jayel Scaduto 05/12/2021, 1:18 PM

## 2021-05-12 NOTE — Progress Notes (Signed)
   05/12/21 0500  Sleep  Number of Hours 5.75

## 2021-05-13 MED ORDER — HALOPERIDOL LACTATE 5 MG/ML IJ SOLN
10.0000 mg | Freq: Every day | INTRAMUSCULAR | Status: DC
  Filled 2021-05-13 (×5): qty 2

## 2021-05-13 MED ORDER — HALOPERIDOL 5 MG PO TABS
10.0000 mg | ORAL_TABLET | Freq: Every morning | ORAL | Status: DC
  Administered 2021-05-13: 10 mg via ORAL
  Filled 2021-05-13 (×2): qty 2

## 2021-05-13 MED ORDER — HALOPERIDOL 5 MG PO TABS
20.0000 mg | ORAL_TABLET | Freq: Every day | ORAL | Status: DC
  Filled 2021-05-13 (×2): qty 4

## 2021-05-13 MED ORDER — TRAZODONE HCL 50 MG PO TABS
50.0000 mg | ORAL_TABLET | Freq: Every evening | ORAL | Status: DC | PRN
  Administered 2021-05-13 – 2021-05-16 (×4): 50 mg via ORAL
  Filled 2021-05-13 (×4): qty 1
  Filled 2021-05-13: qty 7

## 2021-05-13 MED ORDER — HALOPERIDOL 5 MG PO TABS
10.0000 mg | ORAL_TABLET | Freq: Every day | ORAL | Status: DC
  Administered 2021-05-14 – 2021-05-16 (×3): 10 mg via ORAL
  Filled 2021-05-13 (×6): qty 2

## 2021-05-13 MED ORDER — HALOPERIDOL 5 MG PO TABS
20.0000 mg | ORAL_TABLET | Freq: Every day | ORAL | Status: DC
  Administered 2021-05-13 – 2021-05-15 (×3): 20 mg via ORAL
  Filled 2021-05-13 (×5): qty 4

## 2021-05-13 NOTE — Progress Notes (Signed)
Pt visible in dayroom on initial interaction with congruent affect but restless mood. Remains medication compliant. Continues to respond to internal stimuli, talking to self but more of whispers this shift while pacing hall & in dayroom. Denies SI, HI, VH and pain when assessed. PRN Vistaril given for anxiety at 1546 and pt presents calmer, more interactive with peers and staff. Reports he slept well with good appetite and "I'm just hyper right now that's it". Support and encouragement provided to pt throughout this shift. All medications given as ordered with verbal education and effects monitored. All medications given as ordered with verbal education and effects monitored. Pt's mood remains labile but he's verbally redirectable thus far. Tolerates medications and meals well without discomfort.

## 2021-05-13 NOTE — Progress Notes (Signed)
[facility] Second Physician Opinion Progress Note for Medication Administration to Non-consenting Patients (For Involuntarily Committed Patients)  Patient: Alex Kline Date of Birth: November 03, 1991 MRN: [id number]  Reason for the Medication: The patient, without the benefit of the specific treatment measure, is incapable of participating in any available treatment plan that will give the patient a realistic opportunity of improving the patient's condition.  Consideration of Side Effects: Consideration of the side effects related to the medication plan has been given.  Rationale for Medication Administration: Patient is currently psychotic, hypomanic, has nonsensical speech, and needs medication management for stabilization and treatment.  Patient also getting agitated easily, voicing hallucinations, and does not have the ability to understand the need for medication management due to his psychosis    Nelly Rout, MD 05/13/21  11:50 AM   This documentation is good for (7) seven days from the date of the MD signature. New documentation must be completed every seven (7) days with detailed justification in the medical record if the patient requires continued non-emergent administration of psychotropic medications.

## 2021-05-13 NOTE — Progress Notes (Signed)
   05/13/21 2305  COVID-19 Daily Checkoff  Have you had a fever (temp > 37.80C/100F)  in the past 24 hours?  No  If you have had runny nose, nasal congestion, sneezing in the past 24 hours, has it worsened? No

## 2021-05-13 NOTE — Progress Notes (Signed)
   05/13/21 0500  Sleep  Number of Hours 9

## 2021-05-13 NOTE — Progress Notes (Signed)
Recreation Therapy Notes  Date: 6.8.22 Time: 1000 Location: 500 Hall Dayroom  Group Topic: Communication  Goal Area(s) Addresses:  Patient will effectively communicate with peers in group.  Patient will verbalize benefit of healthy communication. Patient will verbalize positive effect of healthy communication on post d/c goals.  Patient will identify communication techniques that made activity effective for group.   Intervention: Drawings, Blank Paper, Pencils  Activity: Geometrical Drawings.  Three volunteers were given a picture to describe to rest of the group.  The presenters were to be as detailed as possible in describing the pictures to the group. If any person didn't understand something or needed to hear the instruction again, they could only ask the presenter to repeat themselves.  Pt were not to ask any detailed questions of the presenters.  When a each round was complete, the presenter would show their picture to the group to see how close to the original picture.  Education: Communication, Discharge Planning  Education Outcome: Acknowledges understanding/In group clarification offered/Needs additional education.   Clinical Observations/Feedback:  Pt did not attend group session.     Caroll Rancher, LRT/CTRS     Caroll Rancher A 05/13/2021 12:31 PM

## 2021-05-13 NOTE — [facility] Group Notes (Signed)
LCSW Group Therapy Note  05/13/2021  Type of Therapy and Topic: Group Therapy: Positive Affirmations  Participation Level:Minimal  Description of Group: This group addressed positive affirmation toward self and others. Patients went around the room and identified two positive things about themselves and two positive things about a peer in the room. Patients reflected on how it felt to share something positive with others, to identify positive things about themselves, and to hear positive things from others. Patients were encouraged to have a daily reflection of positive characteristics or circumstances.  Therapeutic Goals 1. Patient will verbalize two of their positive qualities 2. Patient will demonstrate empathy for others by stating two positive qualities about a peer in the group 3. Patient will verbalize their feelings when voicing positive self affirmations and when voicing positive affirmations of others 4. Patients will discuss the potential positive impact on their wellness/recovery of focusing on positive traits of self and others.  Summary of Patient Progress: Pt attended and interacted with peers appropriately. Pt left during the middle of group.     Therapeutic Modalities Cognitive Behavioral Therapy Motivational Interviewing  Betzayda Braxton MSW, LCSW Clincal Social Worker  [facility]  

## 2021-05-13 NOTE — Progress Notes (Addendum)
[facility] MD Progress Note  05/13/2021 5:20 PM Alex Kline  MRN:  [id number] Reason for admission:  Patient is a 30 year old male with a past psychiatric history significant for schizophrenia versus schizoaffective disorder who presented to the [facility] on 05/07/2021.  At that time he reported symptoms of depression, psychosis and suicidal ideation.  Objective: Medical record reviewed.  Patient's case discussed in detail with members of the treatment team.  I met with and evaluated the patient on the unit for follow-up today.  The patient is in bed asleep and wakes up to engage with me only for a brief period of time before declining to answer questions and returning to sleep.  He appears tired, is guarded and makes minimal if any eye contact.  Speech is of limited amount and he provides mostly one-word answers if any at all.  He states that he is sleeping and eating okay.  He reports that the voices have decreased.  The patient's thought processes remain disorganized and illogical with paranoid delusional themes.  I am unable to engage him in a conversation regarding his need for medications today since he concludes our conversation prematurely and declines to answer additional questions.  He slept 9 hours last night.  Nursing notes document that yesterday in the dining hall he became agitated, pacing and threw a container of cookies across the table in an agitated state.  He continues to respond to internal stimuli intermittently.  Principal Problem: Schizophrenia ([place]) Diagnosis: Principal Problem:   Schizophrenia ([place])  Total Time spent with patient: 15 minutes  Past Psychiatric History: See admission H&P  Past Medical History:  Past Medical History:  Diagnosis Date  . Bipolar disorder ([place])   . Schizophrenia ([place])    History reviewed. No pertinent surgical history. Family History: History reviewed. No pertinent family history. Family Psychiatric  History: See  admission H&P Social History:  Social History   Substance and Sexual Activity  Alcohol Use Yes   Comment: occ     Social History   Substance and Sexual Activity  Drug Use Yes  . Types: Marijuana    Social History   Socioeconomic History  . Marital status: Single    Spouse name: Not on file  . Number of children: Not on file  . Years of education: Not on file  . Highest education level: Not on file  Occupational History  . Not on file  Tobacco Use  . Smoking status: Current Every Day Smoker    Types: Cigarettes  . Smokeless tobacco: Never Used  Substance and Sexual Activity  . Alcohol use: Yes    Comment: occ  . Drug use: Yes    Types: Marijuana  . Sexual activity: Not on file  Other Topics Concern  . Not on file  Social History Narrative  . Not on file   Social Determinants of Health   Financial Resource Strain: Not on file  Food Insecurity: Not on file  Transportation Needs: Not on file  Physical Activity: Not on file  Stress: Not on file  Social Connections: Not on file   Additional Social History:                         Sleep: Good  Appetite:  Good  Current Medications: Current Facility-Administered Medications  Medication Dose Route Frequency Provider Last Rate Last Admin  . acetaminophen (TYLENOL) tablet 650 mg  650 mg Oral Q6H PRN Briant Cedar, MD      .  alum & mag hydroxide-simeth (MAALOX/MYLANTA) 200-200-20 MG/5ML suspension 30 mL  30 mL Oral Q4H PRN Briant Cedar, MD      . benztropine (COGENTIN) tablet 1 mg  1 mg Oral BID Briant Cedar, MD   1 mg at 05/13/21 1652  . diphenhydrAMINE (BENADRYL) injection 50 mg  50 mg Intramuscular Q6H PRN Arthor Captain, MD      . divalproex (DEPAKOTE) DR tablet 500 mg  500 mg Oral Daily Sharma Covert, MD   500 mg at 05/13/21 0827  . divalproex (DEPAKOTE) DR tablet 750 mg  750 mg Oral QHS Sharma Covert, MD   750 mg at 05/12/21 2114  . haloperidol (HALDOL) tablet 10  mg  10 mg Oral q AM Arthor Captain, MD   10 mg at 05/13/21 0827  . haloperidol (HALDOL) tablet 20 mg  20 mg Oral QHS Arthor Captain, MD      . hydrOXYzine (ATARAX/VISTARIL) tablet 25 mg  25 mg Oral TID PRN Briant Cedar, MD   25 mg at 05/13/21 1546  . OLANZapine zydis (ZYPREXA) disintegrating tablet 10 mg  10 mg Oral Q8H PRN Sharma Covert, MD   10 mg at 05/12/21 1318   And  . LORazepam (ATIVAN) tablet 2 mg  2 mg Oral Q6H PRN Sharma Covert, MD       And  . ziprasidone (GEODON) injection 20 mg  20 mg Intramuscular Q8H PRN Sharma Covert, MD   20 mg at 05/09/21 1407  . magnesium hydroxide (MILK OF MAGNESIA) suspension 30 mL  30 mL Oral Daily PRN Briant Cedar, MD      . OLANZapine zydis (ZYPREXA) disintegrating tablet 5 mg  5 mg Oral QHS Sharma Covert, MD   5 mg at 05/12/21 2114  . traZODone (DESYREL) tablet 150 mg  150 mg Oral QHS PRN Sharma Covert, MD   150 mg at 05/12/21 2114  . ziprasidone (GEODON) injection 20 mg  20 mg Intramuscular Once Sharma Covert, MD        Lab Results: No results found for this or any previous visit (from the past 48 hour(s)).  Blood Alcohol level:  Lab Results  Component Value Date   ETH <10 05/07/2021   ETH <10 34/19/6222    Metabolic Disorder Labs: Lab Results  Component Value Date   HGBA1C 5.5 05/07/2021   MPG 111 05/07/2021   MPG 111.15 12/26/2019   Lab Results  Component Value Date   PROLACTIN 24.8 (H) 12/26/2019   Lab Results  Component Value Date   CHOL 141 05/07/2021   TRIG 133 05/07/2021   HDL 31 (L) 05/07/2021   CHOLHDL 4.5 05/07/2021   VLDL 27 05/07/2021   LDLCALC 83 05/07/2021   LDLCALC 82 12/26/2019    Physical Findings: AIMS: Facial and Oral Movements Muscles of Facial Expression: None, normal Lips and Perioral Area: None, normal Jaw: None, normal Tongue: None, normal,Extremity Movements Upper (arms, wrists, hands, fingers): None, normal Lower (legs, knees, ankles, toes): None,  normal, Trunk Movements Neck, shoulders, hips: None, normal, Overall Severity Severity of abnormal movements (highest score from questions above): None, normal Incapacitation due to abnormal movements: None, normal Patient's awareness of abnormal movements (rate only patient's report): No Awareness, Dental Status Current problems with teeth and/or dentures?: No Does patient usually wear dentures?: No  CIWA:    COWS:     Musculoskeletal: Strength & Muscle Tone: within normal limits Gait & Station: normal Patient  leans: N/A  Psychiatric Specialty Exam:  Presentation  General Appearance: Casual  Eye Contact:Minimal  Speech:Normal Rate; Other (comment) (Minimal amount; mostly one-word answers)  Speech Volume:Decreased  Handedness:Right   Mood and Affect  Mood:Anxious; Dysphoric  Affect:Congruent   Thought Process  Thought Processes:Disorganized  Descriptions of Associations:Loose  Orientation:Full (Time, Place and Person)  Thought Content:Delusions; Paranoid Ideation; Illogical  History of Schizophrenia/Schizoaffective disorder:Yes  Duration of Psychotic Symptoms:Greater than six months  Hallucinations:Hallucinations: Auditory  Ideas of Reference:Delusions; Paranoia  Suicidal Thoughts:Suicidal Thoughts: No  Homicidal Thoughts:Homicidal Thoughts: No   Sensorium  Memory:Immediate Poor; Remote Good  Judgment:Impaired  Insight:Shallow   Executive Functions  Concentration:Poor  Attention Span:Poor  Recall:Poor  Fund of Knowledge:Poor  Language:Fair   Psychomotor Activity  Psychomotor Activity:Psychomotor Activity: Decreased   Assets  Assets:Desire for Improvement; Resilience   Sleep  Sleep:Sleep: Good Number of Hours of Sleep: 9    Physical Exam: Physical Exam Vitals and nursing note reviewed.  HENT:     Head: Normocephalic and atraumatic.  Pulmonary:     Effort: Pulmonary effort is normal.  Neurological:     General: No focal  deficit present.     Mental Status: He is alert.    Review of Systems  All other systems reviewed and are negative.  Blood pressure 112/68, pulse 99, temperature 98 F (36.7 C), temperature source Oral, SpO2 100 %. There is no height or weight on file to calculate BMI.   Treatment Plan Summary: Daily contact with patient to assess and evaluate symptoms and progress in treatment, Medication management and Plan :    Diagnosis: 1.  Paranoid schizophrenia 2.  Cannabis use disorder  Pertinent findings on examination today: 1.  Still with paranoid and delusional thinking, disorganization and responding to internal stimuli 2.  Sleep is improved. 3.  Patient continues with intermittent agitated and disorganized behavior 4.  He is unable to participate meaningfully in a conversation regarding his treatment or the need for medications.  He meets criteria for forced medication.  I have asked Dr. Dwyane Dee provide a second opinion regarding the use of forced medications and she is in agreement.  Plan: IVC petition filed today.  First QPE completed by this Probation officer.  Second QPE completed by Dr. Dwyane Dee.  Patient is currently on IVC.  1.  Continue Cogentin 1 mg p.o. twice daily for side effects of medication. 2.  Continue Depakote DR 500 mg p.o. daily and 750 mg p.o. nightly for mood stability. 3.  Continue Haldol 10 mg p.o. or IM daily for psychosis per forced medication protocol. 4.  Continue Haldol 20 mg p.o. or IM nightly for psychosis per forced medication protocol. 5.  Continue hydroxyzine 25 mg p.o. 3 times daily as needed anxiety. 6.  Continue olanzapine agitation protocol as needed. 7.  Continue Zyprexa 5 mg p.o. nightly to augment Haldol and to improve sleep.  This is for psychosis as well. 8.  Decrease trazodone to 50 mg p.o. nightly for insomnia as patient appears overmedicated/oversedated this morning. 9.  VPA level, CBC with differential and liver function enzymes with a.m. draw on  05/14/2021 10.  Disposition planning-in progress. Arthor Captain, MD 05/13/2021, 5:20 PM

## 2021-05-13 NOTE — Progress Notes (Signed)
Pt stated he was feeling better Pt given PRN Trazodone and Vistaril per MAR with HS medication    05/13/21 2300  Psych Admission Type (Psych Patients Only)  Admission Status Voluntary  Psychosocial Assessment  Patient Complaints Anxiety  Eye Contact Fair  Facial Expression Anxious;Animated  Affect Appropriate to circumstance  Speech Logical/coherent  Interaction Minimal;Defensive  Motor Activity Pacing;Fidgety;Restless  Appearance/Hygiene Unremarkable  Behavior Characteristics Cooperative  Mood Suspicious;Preoccupied  Thought Process  Coherency Disorganized;Circumstantial  Content Preoccupation  Delusions Paranoid  Perception Hallucinations  Hallucination Auditory  Judgment Impaired  Confusion Moderate  Danger to Self  Current suicidal ideation? Denies  Danger to Others  Danger to Others None reported or observed

## 2021-05-14 LAB — COMPREHENSIVE METABOLIC PANEL
ALT: 9 U/L (ref 0–44)
AST: 12 U/L — ABNORMAL LOW (ref 15–41)
Albumin: 3.9 g/dL (ref 3.5–5.0)
Alkaline Phosphatase: 69 U/L (ref 38–126)
Anion gap: 7 (ref 5–15)
BUN: 8 mg/dL (ref 6–20)
CO2: 26 mmol/L (ref 22–32)
Calcium: 9.1 mg/dL (ref 8.9–10.3)
Chloride: 105 mmol/L (ref 98–111)
Creatinine, Ser: 0.85 mg/dL (ref 0.61–1.24)
GFR, Estimated: >60 mL/min (ref >60)
Glucose, Bld: 96 mg/dL (ref 70–99)
Potassium: 3.9 mmol/L (ref 3.5–5.1)
Sodium: 138 mmol/L (ref 135–145)
Total Bilirubin: 0.4 mg/dL (ref 0.3–1.2)
Total Protein: 6.2 g/dL — ABNORMAL LOW (ref 6.5–8.1)

## 2021-05-14 LAB — CBC WITH DIFFERENTIAL/PLATELET
Abs Immature Granulocytes: 0.06 10*3/uL (ref 0.00–0.07)
Basophils Absolute: 0 10*3/uL (ref 0.0–0.1)
Basophils Relative: 0 %
Eosinophils Absolute: 0.1 10*3/uL (ref 0.0–0.5)
Eosinophils Relative: 2 %
HCT: 42.5 % (ref 39.0–52.0)
Hemoglobin: 13.6 g/dL (ref 13.0–17.0)
Immature Granulocytes: 1 %
Lymphocytes Relative: 31 %
Lymphs Abs: 1.9 10*3/uL (ref 0.7–4.0)
MCH: 28.2 pg (ref 26.0–34.0)
MCHC: 32 g/dL (ref 30.0–36.0)
MCV: 88 fL (ref 80.0–100.0)
Monocytes Absolute: 0.5 10*3/uL (ref 0.1–1.0)
Monocytes Relative: 8 %
Neutro Abs: 3.5 10*3/uL (ref 1.7–7.7)
Neutrophils Relative %: 58 %
Platelets: 228 10*3/uL (ref 150–400)
RBC: 4.83 MIL/uL (ref 4.22–5.81)
RDW: 13.6 % (ref 11.5–15.5)
WBC: 6.1 10*3/uL (ref 4.0–10.5)
nRBC: 0 % (ref 0.0–0.2)

## 2021-05-14 LAB — VALPROIC ACID LEVEL: Valproic Acid Lvl: 104 ug/mL — ABNORMAL HIGH (ref 50.0–100.0)

## 2021-05-14 MED ORDER — OLANZAPINE 2.5 MG PO TABS
2.5000 mg | ORAL_TABLET | Freq: Every day | ORAL | Status: DC
  Administered 2021-05-14: 2.5 mg via ORAL
  Filled 2021-05-14 (×4): qty 1

## 2021-05-14 NOTE — Progress Notes (Signed)
   05/14/21 0500  Sleep  Number of Hours 9.25

## 2021-05-14 NOTE — Progress Notes (Signed)
Recreation Therapy Notes  Date: 6.9.22 Time: 1000 Location: 500 Hall Dayroom   Group Topic: Leisure Education   Goal Area(s) Addresses: Patient will identify positive leisure activities for use post discharge. Patient will identify at least one positive benefit of participation in leisure activities. Patient will work effectively work by creating a PSA that accurately describes leisure.   Intervention: Innovation, Group Presentation   Activity: Patients were to create a PSA that explained what leisure is, where it can be done and what types of activities are considered leisure.   Education:  Leisure Scientist, physiological, Communication, Discharge Planning   Education Outcome: Acknowledges education/In group clarification offered/Needs additional education.   Clinical Observations/Feedback: Pt did not attend group session.      Caroll Rancher, LRT/CTRS        Lillia Abed, Dustyn Dansereau A 05/14/2021 1:00 PM

## 2021-05-14 NOTE — Progress Notes (Signed)
[facility] MD Progress Note  05/14/2021 5:59 PM Alex Kline  MRN:  [id number] Reason for admission:  Patient is a 30 year old male with a past psychiatric history significant for schizophrenia versus schizoaffective disorder who presented to the [facility] on 05/07/2021.  At that time he reported symptoms of depression, psychosis and suicidal ideation.  Objective: Medical record reviewed.  Patient's case discussed in detail with members of the treatment team.  I met with and evaluated the patient on the unit for follow-up today.  The patient is alert and more engaged in conversation with me today.  He appears improved.  Thought processes are more coherent and goal-directed.  He is still somewhat disorganized overall.  Thought content is more reality based and there is less paranoid content.  He denies auditory hallucinations and I do not observe him responding to her attending to internal stimuli during our conversation.  The patient reports that he is looking forward to getting his job back after discharge and getting another roommate in an apartment.  He states that he is sleeping and eating well.  He denies problems with his medications.  He denies hearing voices.  He denies wish for death, SI, AI or HI.  The patient slept 9.25 hours last night.  His vital signs show some orthostasis likely secondary to medications this morning..Vital signs this morning include BP of 132/63 sitting and 84/63 standing, pulse of 73 sitting and 91 standing, O2 sat of 99% and temperature of 98.7.  Labs this morning included CMP with AST of 12, total protein of 6.2 and otherwise WNL.  CBC and differential were WNL.  Valproic acid level of 104 ug/mL.  Patient has no signs or symptoms of toxicity from Depakote.  Principal Problem: Schizophrenia ([place]) Diagnosis: Principal Problem:   Schizophrenia ([place])  Total Time spent with patient: 20 minutes  Past Psychiatric History: See admission H&P  Past  Medical History:  Past Medical History:  Diagnosis Date   Bipolar disorder ([place])    Schizophrenia ([place])    History reviewed. No pertinent surgical history. Family History: History reviewed. No pertinent family history. Family Psychiatric  History: See admission H&P Social History:  Social History   Substance and Sexual Activity  Alcohol Use Yes   Comment: occ     Social History   Substance and Sexual Activity  Drug Use Yes   Types: Marijuana    Social History   Socioeconomic History   Marital status: Single    Spouse name: Not on file   Number of children: Not on file   Years of education: Not on file   Highest education level: Not on file  Occupational History   Not on file  Tobacco Use   Smoking status: Every Day    Pack years: 0.00    Types: Cigarettes   Smokeless tobacco: Never  Substance and Sexual Activity   Alcohol use: Yes    Comment: occ   Drug use: Yes    Types: Marijuana   Sexual activity: Not on file  Other Topics Concern   Not on file  Social History Narrative   Not on file   Social Determinants of Health   Financial Resource Strain: Not on file  Food Insecurity: Not on file  Transportation Needs: Not on file  Physical Activity: Not on file  Stress: Not on file  Social Connections: Not on file   Additional Social History:  Sleep: Good  Appetite:  Good  Current Medications: Current Facility-Administered Medications  Medication Dose Route Frequency Provider Last Rate Last Admin   acetaminophen (TYLENOL) tablet 650 mg  650 mg Oral Q6H PRN Pashayan, Redgie Grayer, MD       alum & mag hydroxide-simeth (MAALOX/MYLANTA) 200-200-20 MG/5ML suspension 30 mL  30 mL Oral Q4H PRN Briant Cedar, MD       benztropine (COGENTIN) tablet 1 mg  1 mg Oral BID Briant Cedar, MD   1 mg at 05/14/21 1714   diphenhydrAMINE (BENADRYL) injection 50 mg  50 mg Intramuscular Q6H PRN Arthor Captain, MD        divalproex (DEPAKOTE) DR tablet 500 mg  500 mg Oral Daily Sharma Covert, MD   500 mg at 05/14/21 5188   divalproex (DEPAKOTE) DR tablet 750 mg  750 mg Oral QHS Sharma Covert, MD   750 mg at 05/13/21 2051   haloperidol (HALDOL) tablet 10 mg  10 mg Oral Daily Arthor Captain, MD   10 mg at 05/14/21 4166   Or   haloperidol lactate (HALDOL) injection 10 mg  10 mg Intramuscular Daily Arthor Captain, MD       haloperidol (HALDOL) tablet 20 mg  20 mg Oral QHS Arthor Captain, MD   20 mg at 05/13/21 2052   Or   haloperidol lactate (HALDOL) injection 10 mg  10 mg Intramuscular QHS Arthor Captain, MD       hydrOXYzine (ATARAX/VISTARIL) tablet 25 mg  25 mg Oral TID PRN Briant Cedar, MD   25 mg at 05/13/21 2052   OLANZapine zydis (ZYPREXA) disintegrating tablet 10 mg  10 mg Oral Q8H PRN Sharma Covert, MD   10 mg at 05/12/21 1318   And   LORazepam (ATIVAN) tablet 2 mg  2 mg Oral Q6H PRN Sharma Covert, MD       And   ziprasidone (GEODON) injection 20 mg  20 mg Intramuscular Q8H PRN Sharma Covert, MD   20 mg at 05/09/21 1407   magnesium hydroxide (MILK OF MAGNESIA) suspension 30 mL  30 mL Oral Daily PRN Briant Cedar, MD       OLANZapine zydis (ZYPREXA) disintegrating tablet 5 mg  5 mg Oral QHS Sharma Covert, MD   5 mg at 05/13/21 2054   traZODone (DESYREL) tablet 50 mg  50 mg Oral QHS PRN Arthor Captain, MD   50 mg at 05/13/21 2052   ziprasidone (GEODON) injection 20 mg  20 mg Intramuscular Once Sharma Covert, MD        Lab Results:  Results for orders placed or performed during the hospital encounter of 05/08/21 (from the past 48 hour(s))  Valproic acid level     Status: Abnormal   Collection Time: 05/14/21  6:25 AM  Result Value Ref Range   Valproic Acid Lvl 104 (H) 50.0 - 100.0 ug/mL    Comment: Performed at [facility], [place] [address], [place], [place] [address]  Comprehensive metabolic panel     Status: Abnormal    Collection Time: 05/14/21  6:25 AM  Result Value Ref Range   Sodium 138 135 - 145 mmol/L   Potassium 3.9 3.5 - 5.1 mmol/L   Chloride 105 98 - 111 mmol/L   CO2 26 22 - 32 mmol/L   Glucose, Bld 96 70 - 99 mg/dL    Comment: Glucose reference range applies only to samples  taken after fasting for at least 8 hours.   BUN 8 6 - 20 mg/dL   Creatinine, Ser 0.85 0.61 - 1.24 mg/dL   Calcium 9.1 8.9 - 10.3 mg/dL   Total Protein 6.2 (L) 6.5 - 8.1 g/dL   Albumin 3.9 3.5 - 5.0 g/dL   AST 12 (L) 15 - 41 U/L   ALT 9 0 - 44 U/L   Alkaline Phosphatase 69 38 - 126 U/L   Total Bilirubin 0.4 0.3 - 1.2 mg/dL   GFR, Estimated >60 >60 mL/min    Comment: (NOTE) Calculated using the CKD-EPI Creatinine Equation (2021)    Anion gap 7 5 - 15    Comment: Performed at [facility], [place] [address], [place], [place] [address]  CBC with Differential/Platelet     Status: None   Collection Time: 05/14/21  6:25 AM  Result Value Ref Range   WBC 6.1 4.0 - 10.5 K/uL   RBC 4.83 4.22 - 5.81 MIL/uL   Hemoglobin 13.6 13.0 - 17.0 g/dL   HCT 42.5 39.0 - 52.0 %   MCV 88.0 80.0 - 100.0 fL   MCH 28.2 26.0 - 34.0 pg   MCHC 32.0 30.0 - 36.0 g/dL   RDW 13.6 11.5 - 15.5 %   Platelets 228 150 - 400 K/uL   nRBC 0.0 0.0 - 0.2 %   Neutrophils Relative % 58 %   Neutro Abs 3.5 1.7 - 7.7 K/uL   Lymphocytes Relative 31 %   Lymphs Abs 1.9 0.7 - 4.0 K/uL   Monocytes Relative 8 %   Monocytes Absolute 0.5 0.1 - 1.0 K/uL   Eosinophils Relative 2 %   Eosinophils Absolute 0.1 0.0 - 0.5 K/uL   Basophils Relative 0 %   Basophils Absolute 0.0 0.0 - 0.1 K/uL   Immature Granulocytes 1 %   Abs Immature Granulocytes 0.06 0.00 - 0.07 K/uL    Comment: Performed at [facility], [place] [address], [place], [place] [address]    Blood Alcohol level:  Lab Results  Component Value Date   [facility] <10 05/07/2021   ETH <10 32/44/0102    Metabolic Disorder Labs: Lab Results  Component Value Date   HGBA1C 5.5  05/07/2021   MPG 111 05/07/2021   MPG 111.15 12/26/2019   Lab Results  Component Value Date   PROLACTIN 24.8 (H) 12/26/2019   Lab Results  Component Value Date   CHOL 141 05/07/2021   TRIG 133 05/07/2021   HDL 31 (L) 05/07/2021   CHOLHDL 4.5 05/07/2021   VLDL 27 05/07/2021   LDLCALC 83 05/07/2021   LDLCALC 82 12/26/2019    Physical Findings: AIMS: Facial and Oral Movements Muscles of Facial Expression: None, normal Lips and Perioral Area: None, normal Jaw: None, normal Tongue: None, normal,Extremity Movements Upper (arms, wrists, hands, fingers): None, normal Lower (legs, knees, ankles, toes): None, normal, Trunk Movements Neck, shoulders, hips: None, normal, Overall Severity Severity of abnormal movements (highest score from questions above): None, normal Incapacitation due to abnormal movements: None, normal Patient's awareness of abnormal movements (rate only patient's report): No Awareness, Dental Status Current problems with teeth and/or dentures?: No Does patient usually wear dentures?: No  CIWA:    COWS:     Musculoskeletal: Strength & Muscle Tone: within normal limits Gait & Station: normal Patient leans: N/A  Psychiatric Specialty Exam:  Presentation  General Appearance: Casual  Eye Contact:Fair  Speech:Normal Rate  Speech Volume:Decreased  Handedness:Right   Mood and Affect  Mood:Anxious ("Better")  Affect:Congruent   Thought Process  Thought Processes:Coherent; Other (comment) (Less disorganized)  Descriptions of Associations:Circumstantial  Orientation:Full (Time, Place and Person)  Thought Content:Paranoid Ideation  History of Schizophrenia/Schizoaffective disorder:Yes  Duration of Psychotic Symptoms:Greater than six months  Hallucinations:Hallucinations: None  Ideas of Reference:Paranoia (Less paranoid)  Suicidal Thoughts:Suicidal Thoughts: No  Homicidal Thoughts:Homicidal Thoughts: No   Sensorium  Memory:Immediate  Fair; Remote Good  Judgment:Impaired  Insight:Shallow   Executive Functions  Concentration:Fair  Attention Span:Fair  [place]   Psychomotor Activity  Psychomotor Activity:Psychomotor Activity: Normal   Assets  Assets:Desire for Improvement; Resilience   Sleep  Sleep:Sleep: Good Number of Hours of Sleep: 9.25    Physical Exam: Physical Exam Vitals and nursing note reviewed.  Constitutional:      General: He is not in acute distress. HENT:     Head: Normocephalic and atraumatic.  Pulmonary:     Effort: Pulmonary effort is normal.  Neurological:     General: No focal deficit present.     Mental Status: He is alert.   Review of Systems  Psychiatric/Behavioral:  Negative for hallucinations and suicidal ideas. The patient is nervous/anxious. The patient does not have insomnia.   All other systems reviewed and are negative.  Vital signs this morning include BP of 132/63 sitting and 84/63 standing, pulse of 73 sitting and 91 standing, O2 sat of 99% and temperature of 98.7. Blood pressure (!) 84/63, pulse 91, temperature 98.7 F (37.1 C), temperature source Oral, SpO2 98 %. There is no height or weight on file to calculate BMI.   Treatment Plan Summary: Daily contact with patient to assess and evaluate symptoms and progress in treatment, Medication management and Plan :    Diagnosis: 1.  Paranoid schizophrenia 2.  Cannabis use disorder  Pertinent findings on examination today: 1.  Patient verbalizes less paranoid thought content  2.  He is denying hallucinations and only infrequently observed responding to internal stimuli 3.  Patient is calmer 4.  Organization of thought processes is improving  Plan: Continue IVC status  1.  Continue Cogentin 1 mg p.o. twice daily for side effects of medication. 2.  Continue Depakote DR 500 mg p.o. daily and 750 mg p.o. nightly for mood stability. 3.  Continue Haldol 10 mg  p.o. or IM daily for psychosis per forced medication protocol. 4.  Continue Haldol 20 mg p.o. or IM nightly for psychosis per forced medication protocol. 5.  Continue hydroxyzine 25 mg p.o. 3 times daily as needed anxiety. 6.  Continue olanzapine agitation protocol as needed. 7.  Decrease Zyprexa to 2.5 mg p.o. nightly to augment Haldol and to improve sleep.  This is for psychosis as well.  Goal will be to discontinue prior to discharge if possible. 8.  Continue trazodone to 50 mg p.o. nightly for insomnia as patient appears overmedicated/oversedated this morning. 9.  Disposition planning-in progress. Arthor Captain, MD 05/14/2021, 5:59 PM

## 2021-05-14 NOTE — B[facility] Group Notes (Signed)
Occupational Therapy Group Note Date: 05/14/2021 Group Topic/Focus: Self-Esteem  Group Description: Group encouraged increased engagement and participation through discussion focused on self-esteem with an activity and emphasis focused on Strengths Exploration. Group members were given a handout of various strengths and asked to identify ten strengths they currently possess. Group members were also asked to identify from these strengths, which ones were strengths in relationships, profession, and personal fulfillment. Discussion also focused on strengths we would like to possess or improve on and related use/recognition of strengths as a strategy to boost confidence and emphasize healthy self-esteem. Participation Level: Patient did not attend OT group session despite personal invitation.   Plan: Continue to engage patient in OT groups 2 - 3x/week.  05/14/2021  Kennis Wissmann, MOT, OTR/L 

## 2021-05-14 NOTE — [facility] Group Notes (Signed)
The focus of this group is to help patients establish daily goals to achieve during treatment and discuss how the patient can incorporate goal setting into their daily lives to aide in recovery.  Pt attended and participated in group no insight into what group is about.

## 2021-05-14 NOTE — Progress Notes (Signed)
Pt stated he was doing better, pt had no behavior issues this evening. Pt stated he was working with Tx team to help find him somewhere to go at D/C    05/14/21 2200  Psych Admission Type (Psych Patients Only)  Admission Status Involuntary  Psychosocial Assessment  Patient Complaints Anxiety  Eye Contact Fair  Facial Expression Anxious;Animated  Affect Appropriate to circumstance  Speech Logical/coherent  Interaction Minimal;Defensive  Motor Activity Pacing;Fidgety;Restless  Appearance/Hygiene Unremarkable  Behavior Characteristics Cooperative  Mood Anxious;Preoccupied  Thought Process  Coherency Disorganized;Circumstantial  Content Preoccupation  Delusions Paranoid  Perception Hallucinations ("not right now")  Hallucination Auditory  Judgment Impaired  Confusion Moderate  Danger to Self  Current suicidal ideation? Denies  Danger to Others  Danger to Others None reported or observed

## 2021-05-14 NOTE — Progress Notes (Addendum)
Pt denies SI, HI, VH and pain when assessed. Rates his depression, anxiety and hopelessness all 0/10. Reports improvement in mood and AH "It's not that bad, I hear it but it's fine right now". Pt observed with continue whispers but longer period of logical conversation with writer this shift. Expressed concerns about d/c and unit restriction status "I spoke to my father, he say I go stay with him. So when am I going home. I wan to get the injection and go. I thought I can off unit and eat like I've been doing but you have me unit restriction on the board, [place]". Verbalized understanding when educated about d/c procedure and trial process for unit restriction which he had successful accomplished during meals without issues on return to unit. Pt did not require any PRN medications this shift. Pt seen in dayroom and hall at longer intervals this shift but declined groups when prompted.  Pt tolerated meals and medications well without discomfort. Safety checks maintained without incident. All medications given as ordered and effects monitored. Support and encouragement provided to pt throughout this shift.

## 2021-05-14 NOTE — B[facility] Counselor (Signed)
CSW provided homeless resources for this patient.     Taunja Brickner MSW, LCSW Clincal Social Worker  [facility]  

## 2021-05-15 MED ORDER — OLANZAPINE 5 MG PO TABS
5.0000 mg | ORAL_TABLET | Freq: Every day | ORAL | Status: DC
  Administered 2021-05-15: 5 mg via ORAL
  Filled 2021-05-15 (×3): qty 1
  Filled 2021-05-15: qty 2

## 2021-05-15 NOTE — Progress Notes (Signed)
   05/15/21 0500  Sleep  Number of Hours 5.25

## 2021-05-15 NOTE — [facility] Group Notes (Signed)
[facility] Group Notes:  (Nursing/MHT/Case Management/Adjunct)  Date:  05/15/2021  Time:  10:06 AM  Type of Therapy:  Group Therapy  Participation Level:  Minimal  Participation Quality:  Inattentive  Affect:  Flat and Irritable  Cognitive:  Disorganized and Confused  Insight:  Improving  Engagement in Group:  Lacking  Modes of Intervention:  Orientation  Summary of Progress/Problems: Pt goal for today is to stay focus.   Alex Kline 05/15/2021, 10:06 AM

## 2021-05-15 NOTE — B[facility] Group Notes (Signed)
  Patient did not attend group.  SPIRITUALITY GROUP NOTE   Spirituality group facilitated by Chaplain Katy Delita Chiquito, B[facility].   Group Description: Group focused on topic of hope. Patients participated in facilitated discussion around topic, connecting with one another around experiences and definitions for hope. Group members engaged with visual explorer photos, reflecting on what hope looks like for them today. Group engaged in discussion around how their definitions of hope are present today in hospital.   Modalities: Psycho-social ed, [place], Narrative, MI    

## 2021-05-15 NOTE — Tx Team (Signed)
Interdisciplinary Treatment and Diagnostic Plan Update  05/15/2021 Time of Session: 10:30am Alex Kline MRN: [id number]  Principal Diagnosis: Schizophrenia [facility])  Secondary Diagnoses: Principal Problem:   Schizophrenia (HCC)   Current Medications:  Current Facility-Administered Medications  Medication Dose Route Frequency Provider Last Rate Last Admin   acetaminophen (TYLENOL) tablet 650 mg  650 mg Oral Q6H PRN Lauro Franklin, MD       alum & mag hydroxide-simeth (MAALOX/MYLANTA) 200-200-20 MG/5ML suspension 30 mL  30 mL Oral Q4H PRN Lauro Franklin, MD       benztropine (COGENTIN) tablet 1 mg  1 mg Oral BID Lauro Franklin, MD   1 mg at 05/15/21 0959   diphenhydrAMINE (BENADRYL) injection 50 mg  50 mg Intramuscular Q6H PRN Claudie Revering, MD       divalproex (DEPAKOTE) DR tablet 500 mg  500 mg Oral Daily Antonieta Pert, MD   500 mg at 05/15/21 0959   divalproex (DEPAKOTE) DR tablet 750 mg  750 mg Oral QHS Antonieta Pert, MD   750 mg at 05/14/21 2120   haloperidol (HALDOL) tablet 10 mg  10 mg Oral Daily Claudie Revering, MD   10 mg at 05/15/21 6599   Or   haloperidol lactate (HALDOL) injection 10 mg  10 mg Intramuscular Daily Claudie Revering, MD       haloperidol (HALDOL) tablet 20 mg  20 mg Oral QHS Claudie Revering, MD   20 mg at 05/14/21 2120   Or   haloperidol lactate (HALDOL) injection 10 mg  10 mg Intramuscular QHS Claudie Revering, MD       hydrOXYzine (ATARAX/VISTARIL) tablet 25 mg  25 mg Oral TID PRN Lauro Franklin, MD   25 mg at 05/14/21 2120   OLANZapine zydis (ZYPREXA) disintegrating tablet 10 mg  10 mg Oral Q8H PRN Antonieta Pert, MD   10 mg at 05/12/21 1318   And   LORazepam (ATIVAN) tablet 2 mg  2 mg Oral Q6H PRN Antonieta Pert, MD       And   ziprasidone (GEODON) injection 20 mg  20 mg Intramuscular Q8H PRN Antonieta Pert, MD   20 mg at 05/09/21 1407   magnesium hydroxide (MILK OF MAGNESIA) suspension 30 mL  30 mL Oral  Daily PRN Lauro Franklin, MD       OLANZapine (ZYPREXA) tablet 2.5 mg  2.5 mg Oral QHS Claudie Revering, MD   2.5 mg at 05/14/21 2120   traZODone (DESYREL) tablet 50 mg  50 mg Oral QHS PRN Claudie Revering, MD   50 mg at 05/14/21 2120   ziprasidone (GEODON) injection 20 mg  20 mg Intramuscular Once Antonieta Pert, MD       PTA Medications: Medications Prior to Admission  Medication Sig Dispense Refill Last Dose   benztropine (COGENTIN) 1 MG tablet Take 1 tablet (1 mg total) by mouth 2 (two) times daily. (Patient not taking: Reported on 05/08/2021) 60 tablet 2    divalproex (DEPAKOTE) 250 MG DR tablet Take 1 tablet (250 mg total) by mouth 2 (two) times daily.      FLUoxetine (PROZAC) 20 MG capsule Take 1 capsule (20 mg total) by mouth daily. (Patient not taking: Reported on 05/08/2021) 90 capsule 1    haloperidol (HALDOL) 5 MG tablet Take 3 tablets (15 mg total) by mouth at bedtime. (Patient not taking: Reported on 05/08/2021) 90 tablet 2    haloperidol decanoate (HALDOL DECANOATE)  100 MG/ML injection Inject 1.5 mLs (150 mg total) into the muscle every 30 (thirty) days. Due 2/26 (Patient not taking: Reported on 05/08/2021) 1 mL 11    omega-3 acid ethyl esters (LOVAZA) 1 g capsule Take 1 capsule (1 g total) by mouth 2 (two) times daily. (Patient not taking: Reported on 05/08/2021) 60 capsule 2     Patient Stressors:    Patient Strengths:    Treatment Modalities: Medication Management, Group therapy, Case management,  1 to 1 session with clinician, Psychoeducation, Recreational therapy.   Physician Treatment Plan for Primary Diagnosis: Schizophrenia (HCC) Long Term Goal(s): Improvement in symptoms so as ready for discharge Improvement in symptoms so as ready for discharge   Short Term Goals: Ability to identify changes in lifestyle to reduce recurrence of condition will improve Ability to verbalize feelings will improve Ability to disclose and discuss suicidal ideas Ability to demonstrate  self-control will improve Ability to identify and develop effective coping behaviors will improve Ability to maintain clinical measurements within normal limits will improve Compliance with prescribed medications will improve Ability to identify triggers associated with substance abuse/mental health issues will improve Ability to identify changes in lifestyle to reduce recurrence of condition will improve Ability to verbalize feelings will improve Ability to disclose and discuss suicidal ideas Ability to demonstrate self-control will improve Ability to identify and develop effective coping behaviors will improve Ability to maintain clinical measurements within normal limits will improve Compliance with prescribed medications will improve Ability to identify triggers associated with substance abuse/mental health issues will improve  Medication Management: Evaluate patient's response, side effects, and tolerance of medication regimen.  Therapeutic Interventions: 1 to 1 sessions, Unit Group sessions and Medication administration.  Evaluation of Outcomes: Progressing  Physician Treatment Plan for Secondary Diagnosis: Principal Problem:   Schizophrenia (HCC)  Long Term Goal(s): Improvement in symptoms so as ready for discharge Improvement in symptoms so as ready for discharge   Short Term Goals: Ability to identify changes in lifestyle to reduce recurrence of condition will improve Ability to verbalize feelings will improve Ability to disclose and discuss suicidal ideas Ability to demonstrate self-control will improve Ability to identify and develop effective coping behaviors will improve Ability to maintain clinical measurements within normal limits will improve Compliance with prescribed medications will improve Ability to identify triggers associated with substance abuse/mental health issues will improve Ability to identify changes in lifestyle to reduce recurrence of condition will  improve Ability to verbalize feelings will improve Ability to disclose and discuss suicidal ideas Ability to demonstrate self-control will improve Ability to identify and develop effective coping behaviors will improve Ability to maintain clinical measurements within normal limits will improve Compliance with prescribed medications will improve Ability to identify triggers associated with substance abuse/mental health issues will improve     Medication Management: Evaluate patient's response, side effects, and tolerance of medication regimen.  Therapeutic Interventions: 1 to 1 sessions, Unit Group sessions and Medication administration.  Evaluation of Outcomes: Progressing   RN Treatment Plan for Primary Diagnosis: Schizophrenia (HCC) Long Term Goal(s): Knowledge of disease and therapeutic regimen to maintain health will improve  Short Term Goals: Ability to remain free from injury will improve, Ability to verbalize frustration and anger appropriately will improve, Ability to identify and develop effective coping behaviors will improve and Compliance with prescribed medications will improve  Medication Management: RN will administer medications as ordered by provider, will assess and evaluate patient's response and provide education to patient for prescribed medication. RN will report  any adverse and/or side effects to prescribing provider.  Therapeutic Interventions: 1 on 1 counseling sessions, Psychoeducation, Medication administration, Evaluate responses to treatment, Monitor vital signs and CBGs as ordered, Perform/monitor CIWA, COWS, AIMS and Fall Risk screenings as ordered, Perform wound care treatments as ordered.  Evaluation of Outcomes: Progressing   LCSW Treatment Plan for Primary Diagnosis: Schizophrenia (HCC) Long Term Goal(s): Safe transition to appropriate next level of care at discharge, Engage patient in therapeutic group addressing interpersonal concerns.  Short Term  Goals: Engage patient in aftercare planning with referrals and resources, Increase social support, Increase ability to appropriately verbalize feelings, Facilitate patient progression through stages of change regarding substance use diagnoses and concerns, Identify triggers associated with mental health/substance abuse issues and Increase skills for wellness and recovery  Therapeutic Interventions: Assess for all discharge needs, 1 to 1 time with Social worker, Explore available resources and support systems, Assess for adequacy in community support network, Educate family and significant other(s) on suicide prevention, Complete Psychosocial Assessment, Interpersonal group therapy.  Evaluation of Outcomes: Progressing   Progress in Treatment: Attending groups: Yes. Participating in groups: Yes. Taking medication as prescribed: Yes. Toleration medication: Yes. Family/Significant other contact made: Yes, individual(s) contacted:  sister Patient understands diagnosis: Yes. Discussing patient identified problems/goals with staff: Yes. Medical problems stabilized or resolved: Yes. Denies suicidal/homicidal ideation: Yes. Issues/concerns per patient self-inventory: No.   New problem(s) identified: No, Describe:  none  New Short Term/Long Term Goal(s): detox, medication management for mood stabilization; elimination of SI thoughts; development of comprehensive mental wellness/sobriety plan   Patient Goals:  Did not attend  Discharge Plan or Barriers:  Patient recently admitted. CSW will continue to follow and assess for appropriate referrals and possible discharge planning.    Reason for Continuation of Hospitalization: Delusions  Mania Medication stabilization Suicidal ideation  Estimated Length of Stay: 3-5 days  Attendees: Patient: Did not attend 05/15/2021 10:29 AM  Physician:  05/15/2021 10:29 AM  Nursing:  05/15/2021 10:29 AM  RN Care Manager: 05/15/2021 10:29 AM  Social Worker:  Ruthann Cancer, LCSW 05/15/2021 10:29 AM  Recreational Therapist:  05/15/2021 10:29 AM  Other:  05/15/2021 10:29 AM  Other:  05/15/2021 10:29 AM  Other: 05/15/2021 10:29 AM    Scribe for Treatment Team: Felizardo Hoffmann, LCSWA 05/15/2021 10:29 AM

## 2021-05-15 NOTE — Progress Notes (Signed)
[facility] MD Progress Note  05/15/2021 6:15 PM Alex Kline  MRN:  [id number] Reason for admission:  Patient is a 30 year old male with a past psychiatric history significant for schizophrenia versus schizoaffective disorder who presented to the [facility] on 05/07/2021.  At that time he reported symptoms of depression, psychosis and suicidal ideation.  Objective: Medical record reviewed.  Patient's case discussed in detail with members of the treatment team.  I met with and evaluated the patient on the unit for follow-up today.  The patient continues to improve.  He is willing to converse with me in his room today which he has been too guarded to allow during his hospital course until today.  He describes his mood as good.  Affect is constricted.  Speech remains somewhat hypophonic and of limited amount but he is less guarded than he was earlier during his hospital stay.  Thought processes are more organized and coherent although still loose at times.  There is paucity of thought content elicited; however, thought content that is elicited is more focused on reality based topics such as getting a job and where he will live.  He denies SI, AI, AH, VH or PI.  He states he now feels his family is safe and he is no longer worried that he and his family will be harmed.  He reports that he is eating and sleeping well.  He denies any medication side effects.  Nursing staff report only occasionally seeing patient mumble or whisper to self and apparent response to internal stimuli.  The patient slept 5.25 hours last night.  Principal Problem: Schizophrenia ([place]) Diagnosis: Principal Problem:   Schizophrenia ([place])  Total Time spent with patient: 20 minutes  Past Psychiatric History: See admission H&P  Past Medical History:  Past Medical History:  Diagnosis Date   Bipolar disorder ([place])    Schizophrenia ([place])    History reviewed. No pertinent surgical history. Family History:  History reviewed. No pertinent family history. Family Psychiatric  History: See admission H&P Social History:  Social History   Substance and Sexual Activity  Alcohol Use Yes   Comment: occ     Social History   Substance and Sexual Activity  Drug Use Yes   Types: Marijuana    Social History   Socioeconomic History   Marital status: Single    Spouse name: Not on file   Number of children: Not on file   Years of education: Not on file   Highest education level: Not on file  Occupational History   Not on file  Tobacco Use   Smoking status: Every Day    Pack years: 0.00    Types: Cigarettes   Smokeless tobacco: Never  Substance and Sexual Activity   Alcohol use: Yes    Comment: occ   Drug use: Yes    Types: Marijuana   Sexual activity: Not on file  Other Topics Concern   Not on file  Social History Narrative   Not on file   Social Determinants of Health   Financial Resource Strain: Not on file  Food Insecurity: Not on file  Transportation Needs: Not on file  Physical Activity: Not on file  Stress: Not on file  Social Connections: Not on file   Additional Social History:                         Sleep: Fair  Appetite:  Good  Current Medications: Current Facility-Administered Medications  Medication Dose Route Frequency Provider Last Rate Last Admin   acetaminophen (TYLENOL) tablet 650 mg  650 mg Oral Q6H PRN Briant Cedar, MD       alum & mag hydroxide-simeth (MAALOX/MYLANTA) 200-200-20 MG/5ML suspension 30 mL  30 mL Oral Q4H PRN Briant Cedar, MD       benztropine (COGENTIN) tablet 1 mg  1 mg Oral BID Briant Cedar, MD   1 mg at 05/15/21 1706   diphenhydrAMINE (BENADRYL) injection 50 mg  50 mg Intramuscular Q6H PRN Arthor Captain, MD       divalproex (DEPAKOTE) DR tablet 500 mg  500 mg Oral Daily Sharma Covert, MD   500 mg at 05/15/21 0959   divalproex (DEPAKOTE) DR tablet 750 mg  750 mg Oral QHS Sharma Covert,  MD   750 mg at 05/14/21 2120   haloperidol (HALDOL) tablet 10 mg  10 mg Oral Daily Arthor Captain, MD   10 mg at 05/15/21 4585   Or   haloperidol lactate (HALDOL) injection 10 mg  10 mg Intramuscular Daily Arthor Captain, MD       haloperidol (HALDOL) tablet 20 mg  20 mg Oral QHS Arthor Captain, MD   20 mg at 05/14/21 2120   Or   haloperidol lactate (HALDOL) injection 10 mg  10 mg Intramuscular QHS Arthor Captain, MD       hydrOXYzine (ATARAX/VISTARIL) tablet 25 mg  25 mg Oral TID PRN Briant Cedar, MD   25 mg at 05/14/21 2120   OLANZapine zydis (ZYPREXA) disintegrating tablet 10 mg  10 mg Oral Q8H PRN Sharma Covert, MD   10 mg at 05/12/21 1318   And   LORazepam (ATIVAN) tablet 2 mg  2 mg Oral Q6H PRN Sharma Covert, MD       And   ziprasidone (GEODON) injection 20 mg  20 mg Intramuscular Q8H PRN Sharma Covert, MD   20 mg at 05/09/21 1407   magnesium hydroxide (MILK OF MAGNESIA) suspension 30 mL  30 mL Oral Daily PRN Briant Cedar, MD       OLANZapine (ZYPREXA) tablet 5 mg  5 mg Oral QHS Arthor Captain, MD       traZODone (DESYREL) tablet 50 mg  50 mg Oral QHS PRN Arthor Captain, MD   50 mg at 05/14/21 2120   ziprasidone (GEODON) injection 20 mg  20 mg Intramuscular Once Sharma Covert, MD        Lab Results:  Results for orders placed or performed during the hospital encounter of 05/08/21 (from the past 48 hour(s))  Valproic acid level     Status: Abnormal   Collection Time: 05/14/21  6:25 AM  Result Value Ref Range   Valproic Acid Lvl 104 (H) 50.0 - 100.0 ug/mL    Comment: Performed at [facility], [place] [address], [place], [place] [address]  Comprehensive metabolic panel     Status: Abnormal   Collection Time: 05/14/21  6:25 AM  Result Value Ref Range   Sodium 138 135 - 145 mmol/L   Potassium 3.9 3.5 - 5.1 mmol/L   Chloride 105 98 - 111 mmol/L   CO2 26 22 - 32 mmol/L   Glucose, Bld 96 70 - 99 mg/dL    Comment: Glucose  reference range applies only to samples taken after fasting for at least 8 hours.   BUN 8 6 - 20 mg/dL  Creatinine, Ser 0.85 0.61 - 1.24 mg/dL   Calcium 9.1 8.9 - 10.3 mg/dL   Total Protein 6.2 (L) 6.5 - 8.1 g/dL   Albumin 3.9 3.5 - 5.0 g/dL   AST 12 (L) 15 - 41 U/L   ALT 9 0 - 44 U/L   Alkaline Phosphatase 69 38 - 126 U/L   Total Bilirubin 0.4 0.3 - 1.2 mg/dL   GFR, Estimated >60 >60 mL/min    Comment: (NOTE) Calculated using the CKD-EPI Creatinine Equation (2021)    Anion gap 7 5 - 15    Comment: Performed at [facility], [place] [address] [place], [place] [address]  CBC with Differential/Platelet     Status: None   Collection Time: 05/14/21  6:25 AM  Result Value Ref Range   WBC 6.1 4.0 - 10.5 K/uL   RBC 4.83 4.22 - 5.81 MIL/uL   Hemoglobin 13.6 13.0 - 17.0 g/dL   HCT 42.5 39.0 - 52.0 %   MCV 88.0 80.0 - 100.0 fL   MCH 28.2 26.0 - 34.0 pg   MCHC 32.0 30.0 - 36.0 g/dL   RDW 13.6 11.5 - 15.5 %   Platelets 228 150 - 400 K/uL   nRBC 0.0 0.0 - 0.2 %   Neutrophils Relative % 58 %   Neutro Abs 3.5 1.7 - 7.7 K/uL   Lymphocytes Relative 31 %   Lymphs Abs 1.9 0.7 - 4.0 K/uL   Monocytes Relative 8 %   Monocytes Absolute 0.5 0.1 - 1.0 K/uL   Eosinophils Relative 2 %   Eosinophils Absolute 0.1 0.0 - 0.5 K/uL   Basophils Relative 0 %   Basophils Absolute 0.0 0.0 - 0.1 K/uL   Immature Granulocytes 1 %   Abs Immature Granulocytes 0.06 0.00 - 0.07 K/uL    Comment: Performed at [facility], [place] [address] [place], [place] [address]    Blood Alcohol level:  Lab Results  Component Value Date   [facility] <10 05/07/2021   ETH <10 87/56/4332    Metabolic Disorder Labs: Lab Results  Component Value Date   HGBA1C 5.5 05/07/2021   MPG 111 05/07/2021   MPG 111.15 12/26/2019   Lab Results  Component Value Date   PROLACTIN 24.8 (H) 12/26/2019   Lab Results  Component Value Date   CHOL 141 05/07/2021   TRIG 133 05/07/2021   HDL 31 (L)  05/07/2021   CHOLHDL 4.5 05/07/2021   VLDL 27 05/07/2021   LDLCALC 83 05/07/2021   LDLCALC 82 12/26/2019    Physical Findings: AIMS: Facial and Oral Movements Muscles of Facial Expression: None, normal Lips and Perioral Area: None, normal Jaw: None, normal Tongue: None, normal,Extremity Movements Upper (arms, wrists, hands, fingers): None, normal Lower (legs, knees, ankles, toes): None, normal, Trunk Movements Neck, shoulders, hips: None, normal, Overall Severity Severity of abnormal movements (highest score from questions above): None, normal Incapacitation due to abnormal movements: None, normal Patient's awareness of abnormal movements (rate only patient's report): No Awareness, Dental Status Current problems with teeth and/or dentures?: No Does patient usually wear dentures?: No  CIWA:    COWS:     Musculoskeletal: Strength & Muscle Tone: within normal limits Gait & Station: normal Patient leans: N/A  Psychiatric Specialty Exam:  Presentation  General Appearance: Casual; Fairly Groomed  Eye Contact:Fair  Speech:Normal Rate  Speech Volume:Decreased  Handedness:Right   Mood and Affect  Mood:Anxious  Affect:Constricted   Thought Process  Thought Processes:Coherent  Descriptions of Associations:Circumstantial  Orientation:Full (  Time, Place and Person)  Thought Content:Paranoid Ideation  History of Schizophrenia/Schizoaffective disorder:Yes  Duration of Psychotic Symptoms:Greater than six months  Hallucinations:Hallucinations: None  Ideas of Reference:Paranoia  Suicidal Thoughts:Suicidal Thoughts: No  Homicidal Thoughts:Homicidal Thoughts: No   Sensorium  Memory:Immediate Fair; Remote Good  Judgment:Impaired  Insight:Shallow   Executive Functions  Concentration:Fair  Attention Span:Fair  [place]   Psychomotor Activity  Psychomotor Activity:Psychomotor Activity: Normal   Assets   Assets:Desire for Improvement; Resilience; Social Support   Sleep  Sleep:Sleep: Fair Number of Hours of Sleep: 5.25    Physical Exam: Physical Exam Vitals and nursing note reviewed.  Constitutional:      General: He is not in acute distress. HENT:     Head: Normocephalic and atraumatic.  Pulmonary:     Effort: Pulmonary effort is normal.  Neurological:     General: No focal deficit present.     Mental Status: He is alert.   Review of Systems  Psychiatric/Behavioral:  Negative for hallucinations and suicidal ideas. The patient does not have insomnia.   All other systems reviewed and are negative.   Blood pressure 119/71, pulse 74, temperature 99.1 F (37.3 C), temperature source Oral, resp. rate 16, SpO2 100 %. There is no height or weight on file to calculate BMI.   Treatment Plan Summary: Daily contact with patient to assess and evaluate symptoms and progress in treatment, Medication management and Plan :    Diagnosis: 1.  Paranoid schizophrenia 2.  Cannabis use disorder  Pertinent findings on examination today: 1.  Patient verbalizes less paranoid thought content  2.  He is denying hallucinations and only infrequently observed responding to internal stimuli 3.  Patient is calmer 4.  Organization of thought processes is improving  Plan: Continue IVC status  1.  Continue Cogentin 1 mg p.o. twice daily for side effects of medication. 2.  Continue Depakote DR 500 mg p.o. daily and 750 mg p.o. nightly for mood stability. 3.  Continue Haldol 10 mg p.o. or IM daily for psychosis per forced medication protocol. 4.  Continue Haldol 20 mg p.o. or IM nightly for psychosis per forced medication protocol. 5.  Continue hydroxyzine 25 mg p.o. 3 times daily as needed anxiety. 6.  Continue olanzapine agitation protocol as needed. 7.  Increase Zyprexa to 5 mg p.o. nightly to augment Haldol and to improve sleep.  This is for psychosis as well.  He did not sleep as well last  night on 2.5 mg dose. 8.  Continue trazodone to 50 mg p.o. nightly for insomnia  9.  Disposition planning-in progress. Arthor Captain, MD 05/15/2021, 6:15 PM

## 2021-05-15 NOTE — Progress Notes (Signed)
Recreation Therapy Notes  Date: 6.10.22 Time: 1000 Location: 500 Hall Dayroom  Group Topic: Communication, Team Building, Problem Solving   Goal Area(s) Addresses:  Patient will effectively work with peer towards shared goal.  Patient will identify skill used to make activity successful.  Patient will identify how skills used during activity can be used to reach post d/c goals.   Intervention: Cup [employer] bands with attached strings enough for each group member, 10 or more cups and music  Activity: Patient(s) were given a set of solo cups, a rubber band, and some tied strings. The objective is to build a pyramid with the cups by only using the rubber band and string to move the cups. After the activity the patient(s) are LRT debriefed and discussed what strategies worked, what didn't, and what lessons they can take from the activity and use in life post discharge.   Education Areas: Social Skills, Support System, Discharge Planning   Education Outcome: Acknowledges education  Clinical Observations/Feedback: Patient did not attended group session.    Caroll Rancher, LRT/CTRS      Caroll Rancher A 05/15/2021 11:37 AM

## 2021-05-15 NOTE — Progress Notes (Signed)
Pt visible on the unit this evening. Pt given PRN Trazodone and Vistaril with HS medication. Pt stated he was going to his fathers house to get his clothes and then he plans to stay with his grandfather. Pt informed that we have to get verification from the person he plans to stay with before he gets D/C . Pt stated he understood.     05/15/21 2200  Psych Admission Type (Psych Patients Only)  Admission Status Involuntary  Psychosocial Assessment  Patient Complaints Worrying  Eye Contact Fair  Facial Expression Anxious;Worried (Irritable about "not going home right now")  Affect Appropriate to circumstance  Speech Logical/coherent  Interaction Minimal;Defensive  Motor Activity Pacing;Fidgety;Restless  Appearance/Hygiene Unremarkable  Behavior Characteristics Cooperative  Mood Anxious;Preoccupied  Thought Process  Coherency Disorganized;Circumstantial  Content Preoccupation  Delusions Paranoid  Perception Hallucinations ("not right now")  Hallucination Auditory  Judgment Impaired  Confusion Moderate  Danger to Self  Current suicidal ideation? Denies  Danger to Others  Danger to Others None reported or observed

## 2021-05-15 NOTE — BHH Counselor (Signed)
CSW met with this patient to discuss discharge plans.  Patient initially stated that he planned to go to his father house when he discharges. CSW requested consent to speak with pt's father, which he was able to provide however stated he was only going to get dropped off at his fathers now and was not planning on staying with him. When CSW questioned pt on where he plans to stay he makes vague comments about going to his sisters house and getting belongings from his apartment.   CSW reviewed and reinforced shelter and housing resources with this patient.    Darletta Moll MSW, LCSW Clincal Social Worker  [facility]

## 2021-05-16 DIAGNOSIS — F203 Undifferentiated schizophrenia: Secondary | ICD-10-CM | POA: Diagnosis not present

## 2021-05-16 MED ORDER — OLANZAPINE 7.5 MG PO TABS
15.0000 mg | ORAL_TABLET | Freq: Every day | ORAL | Status: DC
  Administered 2021-05-16: 15 mg via ORAL
  Filled 2021-05-16: qty 2
  Filled 2021-05-16: qty 14
  Filled 2021-05-16: qty 2
  Filled 2021-05-16: qty 14

## 2021-05-16 MED ORDER — DIVALPROEX SODIUM ER 250 MG PO TB24
750.0000 mg | ORAL_TABLET | Freq: Every day | ORAL | Status: DC
  Administered 2021-05-16: 750 mg via ORAL
  Filled 2021-05-16: qty 21
  Filled 2021-05-16: qty 3
  Filled 2021-05-16: qty 21
  Filled 2021-05-16: qty 3

## 2021-05-16 MED ORDER — OLANZAPINE 5 MG PO TABS
5.0000 mg | ORAL_TABLET | Freq: Every day | ORAL | Status: DC
  Administered 2021-05-16 – 2021-05-17 (×2): 5 mg via ORAL
  Filled 2021-05-16: qty 7
  Filled 2021-05-16 (×2): qty 1
  Filled 2021-05-16: qty 7
  Filled 2021-05-16: qty 1

## 2021-05-16 MED ORDER — NICOTINE POLACRILEX 2 MG MT GUM: 2.0000 mg | CHEWING_GUM | OROMUCOSAL | Status: DC | PRN

## 2021-05-16 NOTE — Progress Notes (Signed)
   05/16/21 1100  Psych Admission Type (Psych Patients Only)  Admission Status Involuntary  Psychosocial Assessment  Patient Complaints Worrying  Eye Contact Fair  Facial Expression Anxious;Worried (Irritable about "not going home right now")  Affect Appropriate to circumstance  Speech Logical/coherent  Interaction Minimal;Defensive  Motor Activity Pacing;Fidgety;Restless  Appearance/Hygiene Unremarkable  Behavior Characteristics Cooperative  Mood Preoccupied  Thought Process  Coherency Disorganized;Circumstantial  Content Preoccupation  Delusions Paranoid  Perception Hallucinations ("not right now")  Hallucination Auditory  Judgment Impaired  Confusion Moderate  Danger to Self  Current suicidal ideation? Denies  Danger to Others  Danger to Others None reported or observed

## 2021-05-16 NOTE — Progress Notes (Addendum)
[facility] MD Progress Note  05/16/2021 9:37 PM Alex Kline  MRN:  [id number] Reason for admission:  Patient is a 30 year old male with a past psychiatric history significant for schizophrenia versus schizoaffective disorder who presented to the [facility] on 05/07/2021.  At that time he reported symptoms of depression, psychosis and suicidal ideation.  Objective: Medical record reviewed.  Patient's case discussed in detail with members of the treatment team.  I met with and evaluated the patient on the unit for follow-up today.  The patient continues to improve and continues to perseverate on discharge.  We have made multiple attempts today to contact his father and sister.  Patient thinks that he may be able to go to his sisters, but is also willing to go to a shelter, as he has been evicted from his apartment.  He states he does need to get his food stamps and his bus pass from. He is open to reviewing medications, which he endorses will be difficult for him to remember.  He is agreeable to simplifying his discharge medication regimen and having a reevaluation in the morning to assess for effectiveness.  Patient describes his mood is good.  He states he is eating and sleeping well.  He denies SI, AI, AH, VH or PI. He denies any medication side effects.  Nursing staff does not express any concerns.  The patient slept 5.25 hours last night.  Principal Problem: Schizophrenia ([place]) Diagnosis: Principal Problem:   Schizophrenia ([place])  Total Time spent with patient: 45 minutes  Past Psychiatric History: See admission H&P  Past Medical History:  Past Medical History:  Diagnosis Date   Bipolar disorder ([place])    Schizophrenia ([place])    History reviewed. No pertinent surgical history. Family History: History reviewed. No pertinent family history. Family Psychiatric  History: See admission H&P Social History:  Social History   Substance and Sexual Activity  Alcohol Use  Yes   Comment: occ     Social History   Substance and Sexual Activity  Drug Use Yes   Types: Marijuana    Social History   Socioeconomic History   Marital status: Single    Spouse name: Not on file   Number of children: Not on file   Years of education: Not on file   Highest education level: Not on file  Occupational History   Not on file  Tobacco Use   Smoking status: Every Day    Pack years: 0.00    Types: Cigarettes   Smokeless tobacco: Never  Substance and Sexual Activity   Alcohol use: Yes    Comment: occ   Drug use: Yes    Types: Marijuana   Sexual activity: Not on file  Other Topics Concern   Not on file  Social History Narrative   Not on file   Social Determinants of Health   Financial Resource Strain: Not on file  Food Insecurity: Not on file  Transportation Needs: Not on file  Physical Activity: Not on file  Stress: Not on file  Social Connections: Not on file   Additional Social History:          Homeless              Sleep: Fair  Appetite:  Good  Current Medications: Current Facility-Administered Medications  Medication Dose Route Frequency Provider Last Rate Last Admin   acetaminophen (TYLENOL) tablet 650 mg  650 mg Oral Q6H PRN Kai Levins, Redgie Grayer, MD  alum & mag hydroxide-simeth (MAALOX/MYLANTA) 200-200-20 MG/5ML suspension 30 mL  30 mL Oral Q4H PRN Briant Cedar, MD       diphenhydrAMINE (BENADRYL) injection 50 mg  50 mg Intramuscular Q6H PRN Arthor Captain, MD       divalproex (DEPAKOTE ER) 24 hr tablet 750 mg  750 mg Oral QHS Lavella Hammock, MD   750 mg at 05/16/21 1955   hydrOXYzine (ATARAX/VISTARIL) tablet 25 mg  25 mg Oral TID PRN Briant Cedar, MD   25 mg at 05/16/21 1956   magnesium hydroxide (MILK OF MAGNESIA) suspension 30 mL  30 mL Oral Daily PRN Briant Cedar, MD       nicotine polacrilex (NICORETTE) gum 2 mg  2 mg Oral PRN Lavella Hammock, MD       OLANZapine [facility]) tablet 15 mg   15 mg Oral QHS Lavella Hammock, MD   15 mg at 05/16/21 1956   OLANZapine (ZYPREXA) tablet 5 mg  5 mg Oral Daily Lavella Hammock, MD   5 mg at 05/16/21 1717   OLANZapine zydis (ZYPREXA) disintegrating tablet 10 mg  10 mg Oral Q8H PRN Sharma Covert, MD   10 mg at 05/12/21 1318   traZODone (DESYREL) tablet 50 mg  50 mg Oral QHS PRN Arthor Captain, MD   50 mg at 05/16/21 1956   ziprasidone (GEODON) injection 20 mg  20 mg Intramuscular Once Sharma Covert, MD        Lab Results:  No results found for this or any previous visit (from the past 48 hour(s)).   Blood Alcohol level:  Lab Results  Component Value Date   ETH <10 05/07/2021   ETH <10 25/04/3975    Metabolic Disorder Labs: Lab Results  Component Value Date   HGBA1C 5.5 05/07/2021   MPG 111 05/07/2021   MPG 111.15 12/26/2019   Lab Results  Component Value Date   PROLACTIN 24.8 (H) 12/26/2019   Lab Results  Component Value Date   CHOL 141 05/07/2021   TRIG 133 05/07/2021   HDL 31 (L) 05/07/2021   CHOLHDL 4.5 05/07/2021   VLDL 27 05/07/2021   LDLCALC 83 05/07/2021   LDLCALC 82 12/26/2019    Physical Findings: AIMS: Facial and Oral Movements Muscles of Facial Expression: None, normal Lips and Perioral Area: None, normal Jaw: None, normal Tongue: None, normal,Extremity Movements Upper (arms, wrists, hands, fingers): None, normal Lower (legs, knees, ankles, toes): None, normal, Trunk Movements Neck, shoulders, hips: None, normal, Overall Severity Severity of abnormal movements (highest score from questions above): None, normal Incapacitation due to abnormal movements: None, normal Patient's awareness of abnormal movements (rate only patient's report): No Awareness, Dental Status Current problems with teeth and/or dentures?: No Does patient usually wear dentures?: No  CIWA:    COWS:     Musculoskeletal: Strength & Muscle Tone: within normal limits Gait & Station: normal Patient leans:  N/A  Psychiatric Specialty Exam:  Presentation  General Appearance: Casual; Fairly Groomed  Eye Contact:Good  Speech:Normal Rate  Speech Volume:Normal  Handedness:Right   Mood and Affect  Mood:Anxious  Affect:Constricted   Thought Process  Thought Processes:Coherent  Descriptions of Associations:Circumstantial  Orientation:Full (Time, Place and Person)  Thought Content:Paranoid Ideation  History of Schizophrenia/Schizoaffective disorder:Yes  Duration of Psychotic Symptoms:Greater than six months  Hallucinations:Hallucinations: None  Ideas of Reference:Paranoia  Suicidal Thoughts:Suicidal Thoughts: No  Homicidal Thoughts:Homicidal Thoughts: No   Sensorium  Memory:Recent Fair  Judgment:Impaired  Insight:Shallow  Executive Functions  Concentration:Fair  Attention Span:Fair  [place]   Psychomotor Activity  Psychomotor Activity:Psychomotor Activity: Normal   Assets  Assets:Desire for Improvement; Resilience; Social Support   Sleep  Sleep:Sleep: Fair Number of Hours of Sleep: 5.25    Physical Exam: Physical Exam Vitals and nursing note reviewed.  Constitutional:      General: He is not in acute distress. HENT:     Head: Normocephalic and atraumatic.  Cardiovascular:     Rate and Rhythm: Normal rate.  Pulmonary:     Effort: Pulmonary effort is normal.  Musculoskeletal:        General: Normal range of motion.  Neurological:     General: No focal deficit present.     Mental Status: He is alert.   Review of Systems  Constitutional: Negative.   Respiratory: Negative.    Cardiovascular: Negative.   Gastrointestinal: Negative.   Musculoskeletal: Negative.   Neurological: Negative.   Psychiatric/Behavioral:  Negative for depression, hallucinations, memory loss, substance abuse and suicidal ideas. The patient is nervous/anxious. The patient does not have insomnia.   All other systems  reviewed and are negative.   Blood pressure 119/71, pulse 74, temperature 99.1 F (37.3 C), temperature source Oral, resp. rate 16, SpO2 100 %. There is no height or weight on file to calculate BMI.   Treatment Plan Summary: Daily contact with patient to assess and evaluate symptoms and progress in treatment, Medication management and Plan :    Diagnosis: 1.  Paranoid schizophrenia 2.  Cannabis use disorder  Pertinent findings on examination today: 1.  Patient verbalizes less paranoid thought content  2.  He is denying hallucinations and only infrequently observed responding to internal stimuli 3.  Patient is calmer 4.  Organization of thought processes is improving  Plan: Continue IVC status  1.  Discontinue Cogentin 1 mg p.o. twice daily for side effects of medication since stopping Haldol and patient has not required.  2.  Discontinue Depakote DR 500 mg p.o. daily and 750 mg p.o. nightly for mood stability.  And change to Depakote ER 750 mg at HS  Last VPA level was 104 on 05/14/2021, CMP, CBC stable 3.  Discontinue Haldol 10 mg and Haldol 20 mg nightly P.O or IM daily for psychosis per forced medication protocol. 4.  Start Zyprexa 5 mg PO in the morning and 15 mg at HS for schizophrenia 5.  Continue hydroxyzine 25 mg p.o. 3 times daily as needed anxiety. 6.  Continue olanzapine agitation protocol as needed. 7.  Continue trazodone to 50 mg p.o. nightly for insomnia  8.  Encourage cessation of marijuana use 9.  Disposition planning-in progress.  Patient intends to discharge to a shelter or possibly stay with his sister.   Lavella Hammock, MD 05/16/2021, 9:37 PM

## 2021-05-16 NOTE — [facility] Counselor (Signed)
Clinical Social Work Note  CSW tried to contact father River Ambrosio [phone] twice, at 9:55am and 1:40pm, to inform him that patient is going to discharge and come to father's home to retrieve his possessions before going to a homeless shelter to try to get a bed.  HIPAA-compliant voicemails were left.  Patient states that if the shelter [facility]) does not have a bed available for him, he will try [facility] in [place].  If that does not work, he will go to his sister's house and see if he can stay there.  Ambrose Mantle, LCSW 05/16/2021, 1:45 PM

## 2021-05-16 NOTE — [facility] Group Notes (Addendum)
LCSW Group Therapy 11:00 AM   Type of Therapy and Topic:  Group Therapy:  Setting Goals   Participation Level: Did not attend   Description of Group: In this process group, patients discussed using strengths to work toward goals and address challenges.  Patients identified two positive things about themselves and one goal they were working on.  Patients were given the opportunity to share openly and support each other's plan for self-empowerment.  The group discussed the value of gratitude and were encouraged to have a daily reflection of positive characteristics or circumstances.  Patients were encouraged to identify a plan to utilize their strengths to work on current challenges and goals.   Therapeutic Goals Patient will verbalize personal strengths/positive qualities and relate how these can assist with achieving desired personal goals Patients will verbalize affirmation of peers plans for personal change and goal setting Patients will explore the value of gratitude and positive focus as related to successful achievement of goals Patients will verbalize a plan for regular reinforcement of personal positive qualities and circumstances.   Summary of Patient Progress: Patient did not attend.        Therapeutic Modalities Cognitive Behavioral Therapy Motivational Interviewing

## 2021-05-17 MED ORDER — DIVALPROEX SODIUM ER 500 MG PO TB24: 1000.0000 mg | ORAL_TABLET | Freq: Every day | ORAL | 0 refills | Status: AC

## 2021-05-17 MED ORDER — HYDROXYZINE HCL 25 MG PO TABS: 25.0000 mg | ORAL_TABLET | Freq: Three times a day (TID) | ORAL | 0 refills | Status: AC | PRN

## 2021-05-17 MED ORDER — TRAZODONE HCL 50 MG PO TABS: 50.0000 mg | ORAL_TABLET | Freq: Every evening | ORAL | 0 refills | Status: AC | PRN

## 2021-05-17 MED ORDER — OLANZAPINE 5 MG PO TABS: 5.0000 mg | ORAL_TABLET | Freq: Every day | ORAL | 0 refills | Status: AC

## 2021-05-17 MED ORDER — TRAZODONE HCL 50 MG PO TABS: 50.0000 mg | ORAL_TABLET | Freq: Every evening | ORAL | 0 refills | Status: DC | PRN

## 2021-05-17 MED ORDER — OLANZAPINE 15 MG PO TABS: 15.0000 mg | ORAL_TABLET | Freq: Every day | ORAL | 0 refills | Status: DC

## 2021-05-17 MED ORDER — OLANZAPINE 5 MG PO TABS: 5.0000 mg | ORAL_TABLET | Freq: Every day | ORAL | 0 refills | Status: DC

## 2021-05-17 MED ORDER — DIVALPROEX SODIUM ER 250 MG PO TB24: 750.0000 mg | ORAL_TABLET | Freq: Every day | ORAL | 0 refills | Status: DC

## 2021-05-17 MED ORDER — OLANZAPINE 15 MG PO TABS: 15.0000 mg | ORAL_TABLET | Freq: Every day | ORAL | 0 refills | Status: AC

## 2021-05-17 NOTE — Discharge Summary (Addendum)
Physician Discharge Summary Note  Patient:  Alex Kline is an 30 y.o., male MRN:  [id number] DOB:  07/10/1991 Patient phone:  [phone] (home)  Patient address:   [address] [place] [address],  Total Time spent with patient: 30 minutes  Date of Admission:  05/08/2021 Date of Discharge: 05/17/2021  Reason for Admission:  (From MD's admission note): Patient is a 30 year old male with a past psychiatric history significant for schizophrenia versus schizoaffective disorder who presented to the [facility] on 05/07/2021.  He presented voluntarily and unaccompanied reporting symptoms of depression, psychosis and suicidal ideation.  Review of the electronic medical record revealed his last psychiatric hospitalization at our facility was in April 2021.  His discharge medications at that time included Cogentin, Depakote, fluoxetine, Haldol as well as Haldol Decanoate.  He is a very poor historian and difficult to get a history at this point.  Most of the history is collected from the electronic medical record.  It was noted in the comprehensive clinical assessment that the patient was homeless, admitted smoking marijuana as well as crack cocaine.  Psychiatric consultation was obtained that showed that the patient was still reporting suicidal ideation but no specific plan.  He denied auditory or visual hallucinations.  He denied any homicidal ideation.  His medications in the emergency department included Cogentin, Depakote, fluoxetine, Haldol.  He was transferred to our facility on 05/08/2021.  The patient refused to sign consent for treatment.  The patient was initially to be admitted to the [address], but because of his aggressive behavior and attempt to elope as well as refusing to follow verbal redirection he was moved to the [address].  He was given Geodon 20 mg IM and he did decrease in his agitation at that time.  This morning on examination he remains significantly  paranoid.  He apparently threw coffee on the wall on 2 occasions, and had to be redirected.  He was admitted to the hospital for evaluation and stabilization.  Principal Problem: Schizophrenia [facility]) Discharge Diagnoses: Principal Problem:   Schizophrenia [facility])   Past Psychiatric History: See Alex Kline&P  Past Medical History:  Past Medical History:  Diagnosis Date   Bipolar disorder (HCC)    Schizophrenia (HCC)    History reviewed. No pertinent surgical history. Family History: History reviewed. No pertinent family history. Family Psychiatric  History: See Alex Kline&P Social History:  Social History   Substance and Sexual Activity  Alcohol Use Yes   Comment: occ     Social History   Substance and Sexual Activity  Drug Use Yes   Types: Marijuana    Social History   Socioeconomic History   Marital status: Single    Spouse name: Not on file   Number of children: Not on file   Years of education: Not on file   Highest education level: Not on file  Occupational History   Not on file  Tobacco Use   Smoking status: Every Day    Pack years: 0.00    Types: Cigarettes   Smokeless tobacco: Never  Substance and Sexual Activity   Alcohol use: Yes    Comment: occ   Drug use: Yes    Types: Marijuana   Sexual activity: Not on file  Other Topics Concern   Not on file  Social History Narrative   Not on file   Social Determinants of Health   Financial Resource Strain: Not on file  Food Insecurity: Not on file  Transportation  Needs: Not on file  Physical Activity: Not on file  Stress: Not on file  Social Connections: Not on file    Hospital Course:  After the above admission evaluation, Alex Kline's presenting symptoms were noted. Alex Kline was recommended for mood stabilization treatments. The medication regimen targeting those presenting symptoms were discussed with him & initiated with his consent. He was started on Depakote for mood stabilization and Zyprexa for psychosis. He remained in the  hospital for 8 days and was compliant with his medications. He is calm and cooperative and stable for discharge. His thought process is coherent and linear. His presentation and interactions showed steady progress as his medications were addressed and adjusted. His UDS on arrival to the ED was positive for THC, BAL was negative.  He was medicated, stabilized & discharged on the medications as listed on his discharge medication list below. Besides the mood stabilization treatments, Alex Kline was also enrolled & participated in the group counseling sessions being offered & held on this unit. He learned coping skills. He presented no other significant pre-existing medical issues that required treatment. He tolerated his treatment regimen without any adverse effects or reactions reported.   During the course of his hospitalization, the 15-minute checks were adequate to ensure patient's safety. Alex Kline did not display any dangerous, violent or suicidal behavior on the unit.  He interacted with patients & staff appropriately, participated appropriately in the group sessions/therapies. His medications were addressed & adjusted to meet his  needs. He was recommended for outpatient follow-up care & medication management upon discharge to assure continuity of care & mood stability.  At the time of discharge patient is not reporting any acute suicidal/homicidal ideations. He feels more confident about his  self-care & in managing his mental health. He currently denies any new issues or concerns. Education and supportive counseling provided throughout his hospital stay & upon discharge.   Today upon his discharge evaluation with the attending psychiatrist, Alex Kline shares he feels he is doing well. And is ready for discharge. He denies any other specific concerns. He is sleeping well. His appetite is good. He denies other physical complaints. He denies AH/VH, delusional thoughts or paranoia. He does not appear to be  responding to any internal stimuli. He feels that his medications have been helpful & is in agreement to continue his current treatment regimen as recommended. He was able to engage in safety planning including [facility] or contact emergency services if he feels unable to maintain his own safety or the safety of others. Pt had no further questions, comments, or concerns. [facility] with [facility] personal belongings in no apparent distress. Transportation per [place] bus transportation.    Physical Findings: AIMS: Facial and Oral Movements Muscles of Facial Expression: None, normal Lips and Perioral Area: None, normal Jaw: None, normal Tongue: None, normal,Extremity Movements Upper (arms, wrists, hands, fingers): None, normal Lower (legs, knees, ankles, toes): None, normal, Trunk Movements Neck, shoulders, hips: None, normal, Overall Severity Severity of abnormal movements (highest score from questions above): None, normal Incapacitation due to abnormal movements: None, normal Patient's awareness of abnormal movements (rate only patient's report): No Awareness, Dental Status Current problems with teeth and/or dentures?: No Does patient usually wear dentures?: No  CIWA:    COWS:     Musculoskeletal: Strength & Muscle Tone: within normal limits Gait & Station: normal Patient leans: N/A  Psychiatric Specialty Exam:  Presentation  General Appearance: Casual; Fairly Groomed  Eye Contact:Good  Speech:Normal Rate  Speech Volume:Normal  Handedness:Right  Mood and Affect  Mood:Euthymic  Affect:Congruent; Restricted  Thought Process  Thought Processes:Coherent  Descriptions of Associations:Intact  Orientation:Full (Time, Place and Person)  Thought Content:Logical  History of Schizophrenia/Schizoaffective disorder:Yes  Duration of Psychotic Symptoms:Greater than six months  Hallucinations:Hallucinations: None  Ideas of Reference:None  Suicidal  Thoughts:Suicidal Thoughts: No  Homicidal Thoughts:Homicidal Thoughts: No  Sensorium  Memory:Immediate Good; Recent Good; Remote Fair  Judgment:Fair  Insight:Fair  Executive Functions  Concentration:Good  Attention Span:Good  Recall:Good  Fund of Knowledge:Fair  Language:Good  Psychomotor Activity  Psychomotor Activity:Psychomotor Activity: Normal  Assets  Assets:Communication Skills; Desire for Improvement; Physical Health; Resilience  Sleep  Sleep:Sleep: Fair Number of Hours of Sleep: 3.25  Physical Exam: Physical Exam Vitals and nursing note reviewed.  Constitutional:      Appearance: Normal appearance.  Pulmonary:     Effort: Pulmonary effort is normal.  Musculoskeletal:        General: Normal range of motion.     Cervical back: Normal range of motion.  Neurological:     Mental Status: He is alert and oriented to person, place, and time.  Psychiatric:        Attention and Perception: Attention normal. He does not perceive auditory or visual hallucinations.        Mood and Affect: Mood normal.        Speech: Speech normal.        Behavior: Behavior normal. Behavior is cooperative.        Thought Content: Thought content normal. Thought content is not paranoid or delusional. Thought content does not include homicidal or suicidal ideation. Thought content does not include homicidal or suicidal plan.   Review of Systems  Constitutional:  Negative for fever.  HENT:  Negative for congestion, sinus pain and sore throat.   Respiratory:  Negative for cough and shortness of breath.   Cardiovascular: Negative.   Gastrointestinal: Negative.   Genitourinary: Negative.   Musculoskeletal: Negative.   Neurological: Negative.   Blood pressure 132/73, pulse 87, temperature 98.4 F (36.9 C), temperature source Oral, resp. rate 16, SpO2 99 %. There is no height or weight on file to calculate BMI.   Social History   Tobacco Use  Smoking Status Every Day   Pack  years: 0.00   Types: Cigarettes  Smokeless Tobacco Never   Tobacco Cessation:  A prescription for an FDA-approved tobacco cessation medication was offered at discharge and the patient refused   Blood Alcohol level:  Lab Results  Component Value Date   [facility] <10 05/07/2021   ETH <10 12/26/2019    Metabolic Disorder Labs:  Lab Results  Component Value Date   HGBA1C 5.5 05/07/2021   MPG 111 05/07/2021   MPG 111.15 12/26/2019   Lab Results  Component Value Date   PROLACTIN 24.8 (Alex Kline) 12/26/2019   Lab Results  Component Value Date   CHOL 141 05/07/2021   TRIG 133 05/07/2021   HDL 31 (L) 05/07/2021   CHOLHDL 4.5 05/07/2021   VLDL 27 05/07/2021   LDLCALC 83 05/07/2021   LDLCALC 82 12/26/2019    See Psychiatric Specialty Exam and Suicide Risk Assessment completed by Attending Physician prior to discharge.  Discharge destination:  Other:  Shelter  Is patient on multiple antipsychotic therapies at discharge:  No   Has Patient had three or more failed trials of antipsychotic monotherapy by history:  No  Recommended Plan for Multiple Antipsychotic Therapies: NA  Discharge Instructions     Diet -  low sodium heart healthy   Complete by: As directed    Increase activity slowly   Complete by: As directed    Increase activity slowly   Complete by: As directed       Allergies as of 05/17/2021   No Known Allergies      Medication List     STOP taking these medications    benztropine 1 MG tablet Commonly known as: COGENTIN   divalproex 250 MG DR tablet Commonly known as: DEPAKOTE Replaced by: divalproex 500 MG 24 hr tablet   FLUoxetine 20 MG capsule Commonly known as: PROZAC   haloperidol 5 MG tablet Commonly known as: HALDOL   haloperidol decanoate 100 MG/ML injection Commonly known as: HALDOL DECANOATE   omega-3 acid ethyl esters 1 g capsule Commonly known as: LOVAZA       TAKE these medications      Indication  divalproex 500 MG 24 hr  tablet Commonly known as: DEPAKOTE ER Take 2 tablets (1,000 mg total) by mouth at bedtime. Replaces: divalproex 250 MG DR tablet  Indication: Depressive Phase of Manic-Depression   hydrOXYzine 25 MG tablet Commonly known as: ATARAX/VISTARIL Take 1 tablet (25 mg total) by mouth 3 (three) times daily as needed for anxiety.  Indication: Feeling Anxious   OLANZapine 15 MG tablet Commonly known as: ZYPREXA Take 1 tablet (15 mg total) by mouth at bedtime.  Indication: Psychotic Depressive Illness   OLANZapine 5 MG tablet Commonly known as: ZYPREXA Take 1 tablet (5 mg total) by mouth daily. Start taking on: May 18, 2021  Indication: Psychotic Depressive Illness   traZODone 50 MG tablet Commonly known as: DESYREL Take 1 tablet (50 mg total) by mouth at bedtime as needed for sleep.  Indication: Trouble Sleeping        Follow-up Information     [facility]. Go to.   Why: Please go to this provider for therapy and medication management services during the walk in hours for new patients:  Monday through Friday, from 9:00 am to 2:00 pm.  Contact information: [address] [place] [address] [phone]         [facility]. Go to.   Specialty: Behavioral Health Why: Please go to this provider for therapy and medication management services during the walk in hours:  Monday through Wednesday, from 8:00 am to 11:00 am. Services are available first come, first served. Contact information: [address] [place] [address] [phone]                Follow-up recommendations:  Activity:  as tolerated Diet:  Heart healthy  Comments:  Prescriptions were given at discharge.  Patient is agreeable with the discharge plan.  He was given an opportunity to ask questions.  He appears to feel comfortable with discharge and denies any current suicidal or homicidal thoughts.   Patient is instructed prior to discharge to:  Take all medications as prescribed by his mental healthcare provider. Report any adverse effects and or reactions from the medicines to his outpatient provider promptly. Patient has been instructed & cautioned: To not engage in alcohol and or illegal drug use while on prescription medicines. In the event of worsening symptoms, patient is instructed to call the crisis hotline, 911 and or go to the nearest ED for appropriate evaluation and treatment of symptoms. To follow-up with his primary care provider for your other medical issues, concerns and or health care needs.  Signed: Mariel Craft, MD 05/17/2021, 1:10 PM

## 2021-05-17 NOTE — Progress Notes (Signed)
   05/16/21 2000  Psych Admission Type (Psych Patients Only)  Admission Status Involuntary  Psychosocial Assessment  Patient Complaints None  Eye Contact Fair  Facial Expression Anxious;Worried (Irritable about "not going home right now")  Affect Appropriate to circumstance  Speech Logical/coherent  Interaction Minimal;Defensive  Motor Activity Pacing;Fidgety;Restless  Appearance/Hygiene Unremarkable  Behavior Characteristics Cooperative  Mood Anxious;Preoccupied;Pleasant  Thought Process  Coherency Disorganized;Circumstantial  Content Preoccupation  Delusions Paranoid  Perception Hallucinations ("not right now")  Hallucination Auditory  Judgment Impaired  Confusion Moderate  Danger to Self  Current suicidal ideation? Denies  Danger to Others  Danger to Others None reported or observed    05/16/21 2000  Psych Admission Type (Psych Patients Only)  Admission Status Involuntary  Psychosocial Assessment  Patient Complaints None  Eye Contact Fair  Facial Expression Anxious;Worried (Irritable about "not going home right now")  Affect Appropriate to circumstance  Speech Logical/coherent  Interaction Minimal;Defensive  Motor Activity Pacing;Fidgety;Restless  Appearance/Hygiene Unremarkable  Behavior Characteristics Cooperative  Mood Anxious;Preoccupied;Pleasant  Thought Process  Coherency Disorganized;Circumstantial  Content Preoccupation  Delusions Paranoid  Perception Hallucinations ("not right now")  Hallucination Auditory  Judgment Impaired  Confusion Moderate  Danger to Self  Current suicidal ideation? Denies  Danger to Others  Danger to Others None reported or observed

## 2021-05-17 NOTE — [facility] Suicide Risk Assessment (Signed)
[facility] Discharge Suicide Risk Assessment   Principal Problem: Schizophrenia [facility]) Discharge Diagnoses: Principal Problem:   Schizophrenia (HCC)   Total Time spent with patient:  35 minutes Patient is discussed in treatment team and seen.  He has had no behavioral issues, and is not displaying any psychotic symptoms.  He states while hospitalized, "I learned to stay in my place and respect people".  Patient states that he needs to get his food stamps and bus pass from his father's home, but intends to stay at a shelter.  Notifications to family members, sister and father, have been made by voice messages.  Family has been encouraged to call social work or provider with any questions or concerns.  We have made multiple attempts to contact family during this hospitalization.  Medication adjustments were made to simplify medication regimen.  Patient is aware of medication doses and timing.  He is aware of his need for mental health follow-up, and is agreeable to discharge plan.  He is denying any suicidal or homicidal ideation.  He denies any auditory or visual hallucinations.  He knows to return to nearest emergency department or call 911 should he develop any worsening depression symptoms, suicidal ideations or hallucinations.  He has been encouraged to not use illicit substances.    Musculoskeletal: Strength & Muscle Tone: within normal limits Gait & Station: normal Patient leans: N/A  Psychiatric Specialty Exam  Presentation  General Appearance: Casual; Fairly Groomed  Eye Contact:Good  Speech:Normal Rate  Speech Volume:Normal  Handedness:Right   Mood and Affect  Mood:Euthymic  Duration of Depression Symptoms: Greater than two weeks  Affect:Congruent; Restricted   Thought Process  Thought Processes:Coherent  Descriptions of Associations:Intact  Orientation:Full (Time, Place and Person)  Thought Content:Logical  History of Schizophrenia/Schizoaffective  disorder:Yes  Duration of Psychotic Symptoms:Greater than six months  Hallucinations:Hallucinations: None Ideas of Reference:None  Suicidal Thoughts:Suicidal Thoughts: No Homicidal Thoughts:Homicidal Thoughts: No   Sensorium  Memory:Immediate Good; Recent Good; Remote Fair  Judgment:Fair  Insight:Fair   Executive Functions  Concentration:Good  Attention Span:Good  Recall:Good  Fund of Knowledge:Fair  Language:Good   Psychomotor Activity  Psychomotor Activity: Psychomotor Activity: Normal  Assets  Assets:Communication Skills; Desire for Improvement; Physical Health; Resilience   Sleep  Sleep: Sleep: Fair Number of Hours of Sleep: 3.25  Physical Exam: Physical Exam Vitals and nursing note reviewed.  Constitutional:      Appearance: Normal appearance.  HENT:     Head: Normocephalic.  Eyes:     Extraocular Movements: Extraocular movements intact.  Cardiovascular:     Rate and Rhythm: Normal rate.  Pulmonary:     Effort: Pulmonary effort is normal. No respiratory distress.  Musculoskeletal:        General: Normal range of motion.     Cervical back: Normal range of motion.  Neurological:     General: No focal deficit present.     Mental Status: He is alert.   Review of Systems  Constitutional: Negative.   Respiratory: Negative.    Cardiovascular: Negative.   Gastrointestinal: Negative.   Musculoskeletal: Negative.   Neurological: Negative.   Psychiatric/Behavioral:  Negative for depression, hallucinations, memory loss, substance abuse and suicidal ideas. The patient has insomnia. The patient is not nervous/anxious.   Blood pressure 132/73, pulse 87, temperature 98.4 F (36.9 C), temperature source Oral, resp. rate 16, SpO2 99 %. There is no height or weight on file to calculate BMI.  Mental Status Per Nursing Assessment::   On Admission:  Self-harm thoughts  Demographic  Factors:  Male, Adolescent or young adult, Low socioeconomic status, Living  alone, and Unemployed  Loss Factors: Decrease in vocational status  Historical Factors: NA  Risk Reduction Factors:   Sense of responsibility to family and Positive coping skills or problem solving skills  Continued Clinical Symptoms:  Schizophrenia:   Paranoid or undifferentiated type Previous Psychiatric Diagnoses and Treatments Risk of substance abuse  Cognitive Features That Contribute To Risk:  Polarized thinking    Suicide Risk:  Minimal: No identifiable suicidal ideation.  Patients presenting with no risk factors but with morbid ruminations; may be classified as minimal risk based on the severity of the depressive symptoms   Follow-up Information     [facility]. Go to.   Why: Please go to this provider for therapy and medication management services during the walk in hours for new patients:  Monday through Friday, from 9:00 am to 2:00 pm.  Contact information: [address] [place] [address] [phone]         [facility]. Go to.   Specialty: Behavioral Health Why: Please go to this provider for therapy and medication management services during the walk in hours:  Monday through Wednesday, from 8:00 am to 11:00 am. Services are available first come, first served. Contact information: [address] [place] [address] [phone]                Plan Of Care/Follow-up recommendations:  Activity ad lib. Diet as tolerated  On day of discharge following sustained improvement in the affect of this patient, continued report of euthymic mood, repeated denial of suicidal, homicidal, and other violent ideation, adequate interaction with peers, active participation in groups while on the unit, and denial of adverse reactions from medications, the treatment team decided Melbert Botelho was stable for discharge home with scheduled mental health treatment as noted above.  He was able to engage in  safety planning including plan to return to [facility] or contact emergency services if he feels unable to maintain his own safety or the safety of others. Patient had no further questions, comments, or concerns. Discharge into care of self.  He is able to contract for safety.  Family members have been notified of his discharge.   Patient aware to return to nearest crisis center, ED or to call 911 for worsening symptoms of depression, suicidal or homicidal thoughts or AVH.  He is encouraged to abstain from substance use.  Mariel Craft, MD 05/17/2021, 12:14 PM

## 2021-05-17 NOTE — [facility] Group Notes (Signed)
[facility] Group Notes:  (Nursing/MHT/Case Management/Adjunct)  Date:  05/17/2021  Time:  10:47 AM  Type of Therapy:  Group Therapy  Participation Level:  Active  Participation Quality:  Appropriate  Affect:  Anxious and Appropriate  Cognitive:  Appropriate  Insight:  Appropriate  Engagement in Group:  Engaged  Modes of Intervention:  Orientation  Summary of Progress/Problems: Pt goal for today is to chill until he can go home.   Tria Noguera J Maicie Vanderloop 05/17/2021, 10:47 AM

## 2021-05-17 NOTE — Plan of Care (Signed)
Discharge note  Patient verbalizes readiness for discharge. Follow up plan explained, AVS, Transition record and SRA given. Prescriptions and teaching provided. Belongings returned and signed for. Suicide safety plan completed and signed. Patient verbalizes understanding. Patient denies SI/HI and assures this Clinical research associate they will seek assistance should that change. Patient discharged to lobby with bus passes.  Problem: Education: Goal: Knowledge of [facility] Education information/materials will improve Outcome: Adequate for Discharge Goal: Emotional status will improve Outcome: Adequate for Discharge Goal: Mental status will improve Outcome: Adequate for Discharge Goal: Verbalization of understanding the information provided will improve Outcome: Adequate for Discharge   Problem: Health Behavior/Discharge Planning: Goal: Identification of resources available to assist in meeting health care needs will improve Outcome: Adequate for Discharge   Problem: Medication: Goal: Compliance with prescribed medication regimen will improve Outcome: Adequate for Discharge   Problem: Self-Concept: Goal: Ability to disclose and discuss suicidal ideas will improve Outcome: Adequate for Discharge Goal: Will verbalize positive feelings about self Outcome: Adequate for Discharge   Problem: Activity: Goal: Interest or engagement in leisure activities will improve Outcome: Adequate for Discharge Goal: Imbalance in normal sleep/wake cycle will improve Outcome: Adequate for Discharge   Problem: Coping: Goal: Coping ability will improve 05/17/2021 1324 by Raylene Miyamoto, RN Outcome: Adequate for Discharge 05/17/2021 0949 by Raylene Miyamoto, RN Outcome: Progressing Goal: Will verbalize feelings 05/17/2021 1324 by Raylene Miyamoto, RN Outcome: Adequate for Discharge 05/17/2021 0949 by Raylene Miyamoto, RN Outcome: Progressing

## 2021-05-17 NOTE — Progress Notes (Signed)
  [facility] Adult Case Management Discharge Plan :  Will you be returning to the same living situation after discharge:  No.  Going to a shelter, he states, and if he cannot get in to a shelter will go to his sister's house At discharge, do you have transportation home?: No.  Bus passes given Do you have the ability to pay for your medications: No.  Will need assistance from community organization  Release of information consent forms completed and emailed to Medical Records, then turned in to Medical Records by CSW.   Patient to Follow up at:  Follow-up Information     [facility]. Go to.   Why: Please go to this provider for therapy and medication management services during the walk in hours for new patients:  Monday through Friday, from 9:00 am to 2:00 pm.  Contact information: [address] [place] [address] [phone]         [facility]. Go to.   Specialty: Behavioral Health Why: Please go to this provider for therapy and medication management services during the walk in hours:  Monday through Wednesday, from 8:00 am to 11:00 am. Services are available first come, first served. Contact information: [address] [place] [address] [phone]                Next level of care provider has access to [facility] Link:no  Safety Planning and Suicide Prevention discussed: Yes,  with sister -- also tried father multiple times to talk with him about patient coming to get his possessions     Has patient been referred to the Quitline?: N/A patient is not a smoker  Patient has been referred for addiction treatment: Pt. refused referral  Lynnell Chad, LCSW 05/17/2021, 10:15 AM

## 2021-05-17 NOTE — Plan of Care (Signed)
  Problem: Coping: Goal: Coping ability will improve Outcome: Progressing Goal: Will verbalize feelings Outcome: Progressing   

## 2021-05-19 ENCOUNTER — Ambulatory Visit (HOSPITAL_COMMUNITY)
Admission: EM | Admit: 2021-05-19 | Discharge: 2021-05-19 | Disposition: A | Discharge status: Home / Self Care | Payer: No Payment, Other

## 2021-05-19 ENCOUNTER — Other Ambulatory Visit: Payer: Self-pay

## 2021-05-19 ENCOUNTER — Ambulatory Visit (HOSPITAL_COMMUNITY)
Admission: RE | Admit: 2021-05-19 | Discharge: 2021-05-19 | Disposition: A | Discharge status: Home / Self Care | Payer: No Payment, Other | Attending: Psychiatry | Admitting: Psychiatry

## 2021-05-19 NOTE — BH Assessment (Addendum)
Received notice from [facility] (Lindsay,RN) that patient presented as a walk in to [facility] for an assessment.  Patient evaluated by providers Malachy Chamber, NP and Dorena Bodo, NP).   Clinician informed by Malachy Chamber, NP, patient is recommended for admission to the [facility]. Fredna Dow has obtained acceptance by a [facility] provider. Transport coordinated by [facility] to the [facility] from [facility].   Malachy Chamber, NP indicates that patient does not need a TTS assessment. Due to change in process Fredna Dow  was asked to notify Ava of her recommendations. Takia contacted Ava to make aware.

## 2021-05-19 NOTE — ED Notes (Addendum)
Pt discharged out of the system in error. Pt being placed in room #136 for assessment.

## 2021-05-19 NOTE — BH Assessment (Addendum)
Pt assessed by providers at [facility] and transferred to [facility] for obs. During triage pt states "Im not hallucinating or anything" and reports having family issues. Pt reports losing his apartment this month and also losing his job for "reckless things" like making verbal threats to co-workers that was talking about him. TTS asked him what had changed from time he left [facility] to now concerning hallucinations. Pt reports he was only hallucinating when he was using drugs [facility] and cocaine) after he was released from [facility]. Pt denies SI, HI, AVH and is requesting to leave immediately. Pt refused to sign waiver of medical screening exam. Pt stated that he needed to make a phone call because his grandmother is expecting him to call. Pt left AMA and signed wavier for MHT.

## 2021-05-19 NOTE — Progress Notes (Signed)
Patient refuses to be seen by medical staff, refusing medical treatment and psych evaluation.

## 2021-05-19 NOTE — H&P (Signed)
Behavioral Health Medical Screening Exam  Alex Kline is a 30 y.o. male who presents with hallucinations, psychosis, suicidal ideations, and homicidal ideations. He recently was admitted to [facility] after presentation to [facility] for similar symptoms in which he endorse depression, psychosis and suicidal ideation. He admits that after his discharge (LOS 10 days) he used cocaine and marijuana,and furthermore minimizes his symptoms as he states " I don't use like that just a little bit. I hit the blunt and did a little cocaine. I need to come in so I can detox. " Patient explained that his intermittent use doesn't warrant inpatient admission for detox. We further reviewed coping skills that he learned to target his behaviors. He is able to recall a few primarily medication adherence, abstaining from substances and therapy. He endorses hallucinations, however does not appear to be responding to internal stimuli, external stimuli or exhibiting delusional thought disorder. He also endorses suicidal ideations and homicidal ideations, no clear intent at this time. Suspect he is seeking inpatient treatment at the request of his family.   On evaluation patient is alert and oriented, calm and cooperative, very pleasant upon approach.   He is very vague and minimizes most of symptoms including substance abuse. He does not appear to be forthcoming with much information with the exception of requesting inpatient admission. She admits to recent cocaine and marijuana use day of discharge, he does not need detox at this time as he was recently detox after 10 day length of stay. 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    Patient reports  auditory and/or visual hallucinations, does not appear to be responding to internal or external stimuli.  There is no evidence of delusional thought content and patient appears to answer all questions appropriately. Will transfer to [facility]  for continuous observation, medication adjustment and follow up with appropriate outpatient resources.    Total Time spent with patient: 45 minutes  Psychiatric Specialty Exam:  Presentation  General Appearance: Casual; Fairly Groomed  Eye Contact:Good  Speech:Normal Rate  Speech Volume:Normal  Handedness:Right   Mood and Affect  Mood:Euthymic  Affect:Congruent; Restricted   Thought Process  Thought Processes:Coherent  Descriptions of Associations:Intact  Orientation:Full (Time, Place and Person)  Thought Content:Logical  History of Schizophrenia/Schizoaffective disorder:Yes  Duration of Psychotic Symptoms:Greater than six months  Hallucinations:No data recorded Ideas of Reference:None  Suicidal Thoughts:No data recorded Homicidal Thoughts:No data recorded  Sensorium  Memory:Immediate Good; Recent Good; Remote Fair  Judgment:Fair  Insight:Fair   Executive Functions  Concentration:Good  Attention Span:Good  Recall:Good  Fund of Knowledge:Fair  Language:Good   Psychomotor Activity  Psychomotor Activity: No data recorded  Assets  Assets:Communication Skills; Desire for Improvement; Physical Health; Resilience   Sleep  Sleep: No data recorded   Physical Exam: Physical Exam ROS Blood pressure 119/77, pulse (!) 102, temperature 99.6 F (37.6 C), temperature source Oral, resp. rate 18, SpO2 100 %. There is no height or weight on file to calculate BMI.  Musculoskeletal: Strength & Muscle Tone: within normal limits Gait & Station: normal Patient leans: N/A   Recommendations:  Based on my evaluation the patient does not appear to have an emergency medical condition. Recommend patient be transferred to [facility] for continuous observation. Spoke with [employer] and discussed transferring to the facility. Will coordinate care for transfer at this time.  Patient will benefit from increase in medications to target hallucinations. Recently  discharged from [facility] after  28 day length of stay. Patient was found out in the parking lot, he was easily redirected and walked back into the facility. He is advised he is waiting for transportation to [facility].   Maryagnes Amos, FNP 05/19/2021, 3:06 PM

## 2021-05-19 NOTE — Progress Notes (Signed)
AMA wavier signed by patient
